# Patient Record
Sex: Male | Born: 1960 | Race: White | Hispanic: No | Marital: Single | Smoking: Current every day smoker
Health system: Southern US, Community
[De-identification: ages and names within clinical notes are randomized; demographics above are authoritative.]

## PROBLEM LIST (undated history)

## (undated) DIAGNOSIS — IMO0001 Reserved for inherently not codable concepts without codable children: Secondary | ICD-10-CM

## (undated) DIAGNOSIS — Z8781 Personal history of (healed) traumatic fracture: Secondary | ICD-10-CM

## (undated) DIAGNOSIS — E781 Pure hyperglyceridemia: Secondary | ICD-10-CM

## (undated) DIAGNOSIS — F411 Generalized anxiety disorder: Secondary | ICD-10-CM

## (undated) DIAGNOSIS — Z789 Other specified health status: Secondary | ICD-10-CM

## (undated) DIAGNOSIS — L739 Follicular disorder, unspecified: Secondary | ICD-10-CM

## (undated) DIAGNOSIS — M5430 Sciatica, unspecified side: Secondary | ICD-10-CM

## (undated) HISTORY — DX: Generalized anxiety disorder: F41.1

## (undated) HISTORY — DX: Other specified health status: Z78.9

## (undated) HISTORY — DX: Pure hyperglyceridemia: E78.1

## (undated) HISTORY — PX: BONY PELVIS SURGERY: SHX572

## (undated) HISTORY — DX: Reserved for inherently not codable concepts without codable children: IMO0001

## (undated) HISTORY — DX: Follicular disorder, unspecified: L73.9

## (undated) HISTORY — PX: KNEE SURGERY: SHX244

## (undated) HISTORY — DX: Personal history of (healed) traumatic fracture: Z87.81

## (undated) HISTORY — DX: Sciatica, unspecified side: M54.30

---

## 2016-07-29 DIAGNOSIS — Z720 Tobacco use: Secondary | ICD-10-CM | POA: Insufficient documentation

## 2016-07-29 DIAGNOSIS — Z87828 Personal history of other (healed) physical injury and trauma: Secondary | ICD-10-CM | POA: Insufficient documentation

## 2016-11-11 DIAGNOSIS — E781 Pure hyperglyceridemia: Secondary | ICD-10-CM | POA: Insufficient documentation

## 2017-07-27 ENCOUNTER — Ambulatory Visit
Payer: Medicare Other | Attending: Student in an Organized Health Care Education/Training Program | Admitting: Student in an Organized Health Care Education/Training Program

## 2017-07-27 ENCOUNTER — Encounter: Payer: Self-pay | Admitting: Student in an Organized Health Care Education/Training Program

## 2017-07-27 VITALS — BP 143/97 | HR 77 | Temp 98.1°F | Resp 18 | Ht 70.0 in | Wt 220.0 lb

## 2017-07-27 DIAGNOSIS — M79601 Pain in right arm: Secondary | ICD-10-CM | POA: Diagnosis not present

## 2017-07-27 DIAGNOSIS — G894 Chronic pain syndrome: Secondary | ICD-10-CM | POA: Diagnosis present

## 2017-07-27 DIAGNOSIS — M4854XA Collapsed vertebra, not elsewhere classified, thoracic region, initial encounter for fracture: Secondary | ICD-10-CM | POA: Insufficient documentation

## 2017-07-27 DIAGNOSIS — Z9889 Other specified postprocedural states: Secondary | ICD-10-CM | POA: Insufficient documentation

## 2017-07-27 DIAGNOSIS — Z79891 Long term (current) use of opiate analgesic: Secondary | ICD-10-CM | POA: Insufficient documentation

## 2017-07-27 DIAGNOSIS — M542 Cervicalgia: Secondary | ICD-10-CM | POA: Diagnosis not present

## 2017-07-27 DIAGNOSIS — M47816 Spondylosis without myelopathy or radiculopathy, lumbar region: Secondary | ICD-10-CM | POA: Insufficient documentation

## 2017-07-27 DIAGNOSIS — M25511 Pain in right shoulder: Secondary | ICD-10-CM | POA: Diagnosis not present

## 2017-07-27 DIAGNOSIS — F17210 Nicotine dependence, cigarettes, uncomplicated: Secondary | ICD-10-CM | POA: Insufficient documentation

## 2017-07-27 DIAGNOSIS — M25551 Pain in right hip: Secondary | ICD-10-CM | POA: Insufficient documentation

## 2017-07-27 DIAGNOSIS — M25552 Pain in left hip: Secondary | ICD-10-CM | POA: Diagnosis not present

## 2017-07-27 DIAGNOSIS — S22000A Wedge compression fracture of unspecified thoracic vertebra, initial encounter for closed fracture: Secondary | ICD-10-CM

## 2017-07-27 DIAGNOSIS — Z8781 Personal history of (healed) traumatic fracture: Secondary | ICD-10-CM | POA: Diagnosis not present

## 2017-07-27 MED ORDER — MELOXICAM 15 MG PO TABS: 7.5000 mg | ORAL_TABLET | Freq: Two times a day (BID) | ORAL | 0 refills | Status: AC

## 2017-07-27 MED ORDER — TIZANIDINE HCL 4 MG PO CAPS: 4.0000 mg | ORAL_CAPSULE | Freq: Three times a day (TID) | ORAL | 0 refills | Status: DC | PRN

## 2017-07-27 NOTE — Progress Notes (Signed)
Safety precautions to be maintained throughout the outpatient stay will include: orient to surroundings, keep bed in low position, maintain call bell within reach at all times, provide assistance with transfer out of bed and ambulation.  

## 2017-07-27 NOTE — Progress Notes (Signed)
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 Thank you

## 2017-07-27 NOTE — Patient Instructions (Signed)
#  1. Please sign if patient release of records so we can get all of your imaging studies from prior hospitals. I'm specifically interested in x-rays, CT scans and MRIs. Please send them to our clinic so that we can fax them into our medical record system.  #2 referral to pain psychology   #3. Tizanidine 4 mg 3 times a day when necessary muscle spasms, Mobic 7.5 mg twice a day for 14 days. Patient instructed to discontinue ibuprofen while he is taking meloxicam. He is instructed to take this after meals.  #4. Urine drug sign today.  #5. Please follow up with me only after you have done your pain psychology evaluation and after all of your records have been sent to our clinic. I will not be able to see you and provide a management plan until we have all of your imaging studies and the pain psychology evaluation. Thank you

## 2017-08-01 ENCOUNTER — Other Ambulatory Visit: Payer: Self-pay

## 2017-08-02 LAB — COMPLIANCE DRUG ANALYSIS, UR

## 2017-09-25 ENCOUNTER — Encounter: Payer: Self-pay | Admitting: Urology

## 2017-09-25 ENCOUNTER — Ambulatory Visit (INDEPENDENT_AMBULATORY_CARE_PROVIDER_SITE_OTHER): Payer: Medicare Other | Admitting: Urology

## 2017-09-25 VITALS — BP 152/95 | HR 92 | Ht 70.0 in | Wt 225.1 lb

## 2017-09-25 DIAGNOSIS — Z125 Encounter for screening for malignant neoplasm of prostate: Secondary | ICD-10-CM

## 2017-09-25 DIAGNOSIS — N5089 Other specified disorders of the male genital organs: Secondary | ICD-10-CM

## 2017-09-25 LAB — URINALYSIS, COMPLETE
BILIRUBIN UA: NEGATIVE
GLUCOSE, UA: NEGATIVE
KETONES UA: NEGATIVE
Leukocytes, UA: NEGATIVE
NITRITE UA: NEGATIVE
Protein, UA: NEGATIVE
RBC, UA: NEGATIVE
Specific Gravity, UA: 1.025 (ref 1.005–1.030)
Urobilinogen, Ur: 0.2 mg/dL (ref 0.2–1.0)
pH, UA: 6 (ref 5.0–7.5)

## 2017-09-25 NOTE — Progress Notes (Signed)
09/25/2017 9:10 AM   Burr Medico [birth year] [id number]  Referring provider: Barbette Reichmann, MD [address] [facility] [place], [place] [address]  Chief Complaint  Patient presents with  . New Patient (Initial Visit)    testicle lump    HPI: Pt c/o "lump" in the cords for 18 - 24 months. He notices they get bigger and smaller, left > right. He's not been treated for it. No pain. He has a lot of back and abd pain from prior back and pelvic fractures and does PT. He has two kids. He has a good libido ("great"). He has adequate erections. No weak stream, frequency or urgency. He c/o decreased ejaculate volume.   Modifying factors: There are no other modifying factors  Associated signs and symptoms: There are no other associated signs and symptoms Aggravating and relieving factors: There are no other aggravating or relieving factors Severity: Moderate Duration: Persistent   Many other complaints - diarrhea, skin rash, sinuses.   PMH: Past Medical History:  Diagnosis Date  . Medical history reviewed with no changes     Surgical History: Past Surgical History:  Procedure Laterality Date  . BONY PELVIS SURGERY Left   . KNEE SURGERY Bilateral     Home Medications:  Allergies as of 09/25/2017   No Known Allergies     Medication List       Accurate as of 09/25/17  9:10 AM. Always use your most recent med list.          CALCIUM 600 600 MG Tabs tablet Generic drug:  calcium carbonate Take 600 mg by mouth daily.   gabapentin 800 MG tablet Commonly known as:  NEURONTIN Take 800 mg by mouth 4 (four) times daily.   Glucosamine 500 MG Caps Take by mouth 3 (three) times daily.   glucosamine-chondroitin 500-400 MG tablet Take 1 tablet by mouth 3 (three) times daily.   ibuprofen 800 MG tablet Commonly known as:  ADVIL,MOTRIN Take 800 mg by mouth 2 (two) times daily.   Magnesium 500 MG Caps Take by mouth 3 (three) times daily.     multivitamin capsule Take 1 capsule by mouth daily.   oxyCODONE-acetaminophen 5-325 MG tablet Commonly known as:  PERCOCET/ROXICET Take by mouth every 8 (eight) hours as needed for severe pain.   tiZANidine 4 MG capsule Commonly known as:  ZANAFLEX Take 1 capsule (4 mg total) by mouth 3 (three) times daily as needed for muscle spasms.       Allergies: No Known Allergies  Family History: Family History  Problem Relation Age of Onset  . Prostate cancer Neg Hx   . Bladder Cancer Neg Hx   . Kidney cancer Neg Hx     Social History:  reports that he has been smoking.  He has been smoking about 2.00 packs per day. He has quit using smokeless tobacco. He reports that he drinks alcohol. He reports that he does not use drugs.  ROS: UROLOGY Frequent Urination?: No Hard to postpone urination?: No Burning/pain with urination?: No Get up at night to urinate?: No Leakage of urine?: Yes Urine stream starts and stops?: No Trouble starting stream?: No Do you have to strain to urinate?: No Blood in urine?: No Urinary tract infection?: No Sexually transmitted disease?: No Injury to kidneys or bladder?: No Painful intercourse?: No Weak stream?: No Erection problems?: No Penile pain?: No  Gastrointestinal Nausea?: No Vomiting?: No Indigestion/heartburn?: Yes Diarrhea?: Yes Constipation?: No  Constitutional Fever: No Night sweats?: No  Weight loss?: No Fatigue?: No  Skin Skin rash/lesions?: Yes Itching?: Yes  Eyes Blurred vision?: Yes Double vision?: No  Ears/Nose/Throat Sore throat?: Yes Sinus problems?: Yes  Hematologic/Lymphatic Swollen glands?: No Easy bruising?: No  Cardiovascular Leg swelling?: No Chest pain?: No  Respiratory Cough?: Yes Shortness of breath?: No  Endocrine Excessive thirst?: No  Musculoskeletal Back pain?: Yes Joint pain?: Yes  Neurological Headaches?: No Dizziness?: No  Psychologic Depression?: No Anxiety?:  No  Physical Exam: BP (!) 152/95 (BP Location: Right Arm, Patient Position: Sitting, Cuff Size: Normal)   Pulse 92   Ht 5\' 10"  (1.778 m)   Wt 102.1 kg (225 lb 1.6 oz)   BMI 32.30 kg/m   Constitutional:  Alert and oriented, No acute distress. HEENT: [place] AT, moist mucus membranes.  Trachea midline, no masses. Cardiovascular: No clubbing, cyanosis, or edema. Respiratory: Normal respiratory effort, no increased work of breathing. GI: Abdomen is soft, nontender, nondistended, no abdominal masses, no inguinal hernia GU: No CVA tenderness.  Penis - circumcised, no lesion or mass, sightly buried  Scrotum - testicles, epididymides, cords palpably normal  **He was examined standing and supine** DRE - prostate 20 grams, normal  Skin: No rashes, bruises or suspicious lesions. Lymph: No cervical or inguinal adenopathy. Neurologic: Grossly intact, no focal deficits, moving all 4 extremities. Psychiatric: Normal mood and affect.  Laboratory Data: No results found for: WBC, HGB, HCT, MCV, PLT  No results found for: CREATININE  No results found for: PSA1  No results found for: TESTOSTERONE  No results found for: HGBA1C  Urinalysis No results found for: SPECGRAV, PHUR, COLORU, APPEARANCEUR, LEUKOCYTESUR, PROTEINUR, GLUCOSEU, KETONESU, RBCU, BILIRUBINUR, UUROB, NITRITE  No results found for: LABMICR, WBCUA, RBCUA, LABEPIT, MUCUS, BACTERIA   No results found for this or any previous visit. No results found for this or any previous visit. No results found for this or any previous visit. No results found for this or any previous visit. No results found for this or any previous visit. No results found for this or any previous visit. No results found for this or any previous visit. No results found for this or any previous visit.  Assessment & Plan:    1. 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 Pt reassured. We went over testicle and cord anatomy and discussed if he  notes any changes to f/u for another exam. His testicles are on the smaller end, so we discussed checking a T level. Also, discussed PSA screening. Discussed scrotal US but pt declined. He did have mri in 2017 and said his testicles may have been seen - I reviewed the reports and no comment on testicles. 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 He asked if I could check for toxins and we discussed a T level is a check of his testicle function but not specific for toxins and he should talk to Dr. Marcello Fennel. He said this is the first time in months he's even had trouble finding the knots in the testicles. He's perplexed why his exam is so normal - even when he does a self exam.    I did call Dr. Marcello Fennel to discuss patients psychiatric history. Nurse noted patient was talking to himself in the room and walking down the hall. Dr. Marcello Fennel made a note and will evaluate patient. Pt did not seem violent or threatening.  - Urinalysis, Complete   No Follow-up on file.  Jerilee FieldESKRIDGE, Lazlo Tunney, MD  [facility] [address], [address] [place], [place] [address] [phone]

## 2017-09-26 LAB — PSA: PROSTATE SPECIFIC AG, SERUM: 0.6 ng/mL (ref 0.0–4.0)

## 2017-09-26 LAB — TESTOSTERONE: TESTOSTERONE: 255 ng/dL — AB (ref 264–916)

## 2017-09-29 ENCOUNTER — Telehealth: Payer: Self-pay | Admitting: *Deleted

## 2017-09-29 NOTE — Telephone Encounter (Signed)
Patient had called earlier wanting lab results and they had not been reviewed by the provider yet. Michelle bell printed the lab and tried to get another MD in the clinic to address it and the answer was Dr. Mena GoesEskridge would be in next week. Patient states he was told on the day of blood work it would not take but 48 hours and he was worried something was wrong. I let him know that's about the time they came back from being ran not when the MD has had time to review and send nurse a message to call. I let him know that I would send you a message to please look at his labs. Patient ok with plan.

## 2017-10-03 ENCOUNTER — Telehealth: Payer: Self-pay

## 2017-10-03 NOTE — Telephone Encounter (Signed)
Christopher Mcneil, Matthew, MD  [name], [name], CMA        Notify patient his PSA was normal. His testosterone was a little low at 255. Give results.    Spoke to pt. Gave results. Pt verbalized understanding.

## 2017-10-18 ENCOUNTER — Telehealth: Payer: Self-pay | Admitting: Urology

## 2017-10-18 NOTE — Telephone Encounter (Signed)
Patient came into the office today and was upset about the after visit summary that you stated for him to return if his symptoms worsened or failed to improve. He stated that you told him that his condition would never improve. He would like for you to listen to the voicemail that he left and either call him to discuss or I did offer him an appointment.  Please advise  Marcelino DusterMichelle

## 2017-11-17 DIAGNOSIS — L739 Follicular disorder, unspecified: Secondary | ICD-10-CM | POA: Insufficient documentation

## 2018-03-20 DIAGNOSIS — D72829 Elevated white blood cell count, unspecified: Secondary | ICD-10-CM | POA: Insufficient documentation

## 2019-07-18 DIAGNOSIS — M545 Low back pain, unspecified: Secondary | ICD-10-CM | POA: Insufficient documentation

## 2019-07-18 DIAGNOSIS — F411 Generalized anxiety disorder: Secondary | ICD-10-CM | POA: Insufficient documentation

## 2019-07-18 DIAGNOSIS — G8929 Other chronic pain: Secondary | ICD-10-CM | POA: Insufficient documentation

## 2019-10-02 ENCOUNTER — Other Ambulatory Visit: Payer: Self-pay

## 2019-10-02 ENCOUNTER — Encounter: Payer: Self-pay | Admitting: Urology

## 2019-10-02 ENCOUNTER — Ambulatory Visit (INDEPENDENT_AMBULATORY_CARE_PROVIDER_SITE_OTHER): Payer: Medicare HMO | Admitting: Urology

## 2019-10-02 VITALS — BP 145/87 | HR 88 | Ht 70.0 in | Wt 226.0 lb

## 2019-10-02 DIAGNOSIS — E291 Testicular hypofunction: Secondary | ICD-10-CM

## 2019-10-02 DIAGNOSIS — N5319 Other ejaculatory dysfunction: Secondary | ICD-10-CM

## 2019-10-02 NOTE — Progress Notes (Signed)
10/02/2019 10:42 AM   [name] 1961/09/15 [id number]  Referring provider: Tracie Harrier, MD [address] [facility] [place],  [place] [address]  Chief Complaint  Patient presents with  . Erectile Dysfunction    HPI: Christopher Mcneil is a 58 year old male seen in consultation at the request of Dr. Ginette Pitman for evaluation of erectile dysfunction.  This is a new problem.  He was seen in 2018 by Dr. Junious Silk for evaluation masses of the spermatic cord.  His primary complaint is no sensation of ejaculation which has been present for approximately 6 months.  Unable to identify any precipitating, aggravating or alleviating factors and states the problem is always present.  He does have a normal volume of semen but complains of decreased sensation.  He is not sexually active but states the reason is because of this problem.  He feels his erections are adequate.  He was seen by Dr. Junious Silk October 2018 for spermatic cord "masses".  His examination was normal at that time and he was unable to find the abnormalities for Dr. Junious Silk.  He complains today that he still has these masses and also wanted to have them reassessed.  He denies scrotal pain.  He does note some tiredness and fatigue.  He did have a testosterone level checked in 2018 which was low at 255 ng/dL.  He relates good libido.  SHIM was 19/25 indicating mild ED.  He denies bothersome lower urinary tract symptoms.  Denies dysuria or gross hematuria.  He is on gabapentin for chronic low back pain.   PMH: Past Medical History:  Diagnosis Date  . Folliculitis   . GAD (generalized anxiety disorder)   . History of fracture of pelvis   . Hypertriglyceridemia   . Medical history reviewed with no changes   . Sciatic nerve pain     Surgical History: Past Surgical History:  Procedure Laterality Date  . BONY PELVIS SURGERY Left   . KNEE SURGERY Bilateral     Home Medications:  Allergies as of 10/02/2019    No Known Allergies     Medication List       Accurate as of October 02, 2019 10:42 AM. If you have any questions, ask your nurse or doctor.        Calcium 600 600 MG Tabs tablet Generic drug: calcium carbonate Take 600 mg by mouth daily.   DULoxetine 30 MG capsule Commonly known as: CYMBALTA Take by mouth.   gabapentin 300 MG capsule Commonly known as: NEURONTIN Take 600 mg by mouth 3 (three) times daily. What changed: Another medication with the same name was removed. Continue taking this medication, and follow the directions you see here. Changed by: Abbie Sons, MD   Glucosamine 500 MG Caps Take by mouth 3 (three) times daily.   glucosamine-chondroitin 500-400 MG tablet Take 1 tablet by mouth 3 (three) times daily.   hydrocortisone 2.5 % cream APPLY TO AFFECTED AREA TWICE A DAY   ibuprofen 800 MG tablet Commonly known as: ADVIL Take by mouth. What changed: Another medication with the same name was removed. Continue taking this medication, and follow the directions you see here. Changed by: Abbie Sons, MD   linezolid 600 MG tablet Commonly known as: ZYVOX Take 600 mg by mouth 2 (two) times daily.   Magnesium 200 MG Tabs Take by mouth.   Magnesium 500 MG Caps Take by mouth 3 (three) times daily.   multivitamin capsule Take 1 capsule by mouth daily.  nystatin cream Commonly known as: MYCOSTATIN APPLY TO AFFECTED AREA TWICE A DAY   oxyCODONE-acetaminophen 5-325 MG tablet Commonly known as: PERCOCET/ROXICET Take by mouth. What changed: Another medication with the same name was removed. Continue taking this medication, and follow the directions you see here. Changed by: Riki AltesScott C , MD   tiZANidine 4 MG tablet Commonly known as: ZANAFLEX Take 4 mg by mouth 3 (three) times daily. What changed: Another medication with the same name was removed. Continue taking this medication, and follow the directions you see here. Changed by: Riki AltesScott C  , MD   triamcinolone 0.1 % paste Commonly known as: KENALOG Apply topically.       Allergies: No Known Allergies  Family History: Family History  Problem Relation Age of Onset  . Prostate cancer Neg Hx   . Bladder Cancer Neg Hx   . Kidney cancer Neg Hx     Social History:  reports that he has been smoking. He has been smoking about 2.00 packs per day. He has quit using smokeless tobacco. He reports current alcohol use. He reports that he does not use drugs.  ROS: UROLOGY Frequent Urination?: No Hard to postpone urination?: No Burning/pain with urination?: No Get up at night to urinate?: No Leakage of urine?: No Urine stream starts and stops?: No Trouble starting stream?: No Do you have to strain to urinate?: No Blood in urine?: No Urinary tract infection?: No Sexually transmitted disease?: No Injury to kidneys or bladder?: No Painful intercourse?: No Weak stream?: No Erection problems?: No Penile pain?: No  Gastrointestinal Nausea?: No Vomiting?: No Indigestion/heartburn?: Yes Diarrhea?: Yes Constipation?: No  Constitutional Fever: No Night sweats?: No Weight loss?: No Fatigue?: Yes  Skin Skin rash/lesions?: Yes Itching?: Yes  Eyes Blurred vision?: Yes Double vision?: No  Ears/Nose/Throat Sore throat?: No Sinus problems?: Yes  Hematologic/Lymphatic Swollen glands?: No Easy bruising?: No  Cardiovascular Leg swelling?: No Chest pain?: No  Respiratory Cough?: Yes Shortness of breath?: Yes  Endocrine Excessive thirst?: No  Musculoskeletal Back pain?: Yes Joint pain?: Yes  Neurological Headaches?: No Dizziness?: No  Psychologic Depression?: No Anxiety?: No  Physical Exam: BP (!) 145/87 (BP Location: Left Arm, Patient Position: Sitting, Cuff Size: Normal)   Pulse 88   Ht 5\' 10"  (1.778 m)   Wt 226 lb (102.5 kg)   BMI 32.43 kg/m   Constitutional:  Alert and oriented, No acute distress. HEENT: [place] AT, moist mucus  membranes.  Trachea midline, no masses. Cardiovascular: No clubbing, cyanosis, or edema. Respiratory: Normal respiratory effort, no increased work of breathing. GI: Abdomen is soft, nontender, nondistended, no abdominal masses GU: Phallus is normal without lesions.  Testes descended bilaterally without masses or tenderness.  Spermatic cord/epididymis palpably normal bilaterally without masses.  He was again today unable to locate these masses that he refers to. Lymph: No cervical or inguinal lymphadenopathy. Skin: No rashes, bruises or suspicious lesions. Neurologic: Grossly intact, no focal deficits, moving all 4 extremities. Psychiatric: Normal mood and affect.   Assessment & Plan:    - Ejaculatory disorder We discussed that ejaculatory problems are difficult to treat.  He did have a low testosterone level in 2018 which could potentially be contributing to his problem.  Testosterone level was repeated today along with a LH and prolactin.  He will be notified with results.  Potentially a brief trial of TRT or Clomid to increase his testosterone level could be done to see if this improves his problem.  He will be notified with  his lab results and further recommendations.   Riki Altes, MD  [facility] [address], [address] [place], [place] [address] [phone]

## 2019-10-03 ENCOUNTER — Ambulatory Visit: Payer: Medicare Other | Admitting: Urology

## 2019-10-03 LAB — TESTOSTERONE: Testosterone: 304 ng/dL (ref 264–916)

## 2019-10-03 LAB — PROLACTIN: Prolactin: 20.1 ng/mL — ABNORMAL HIGH (ref 4.0–15.2)

## 2019-10-03 LAB — LUTEINIZING HORMONE: LH: 5.8 m[IU]/mL (ref 1.7–8.6)

## 2019-10-06 ENCOUNTER — Encounter: Payer: Self-pay | Admitting: Urology

## 2019-10-07 ENCOUNTER — Telehealth: Payer: Self-pay | Admitting: Urology

## 2019-10-07 ENCOUNTER — Other Ambulatory Visit: Payer: Self-pay | Admitting: Urology

## 2019-10-07 DIAGNOSIS — E221 Hyperprolactinemia: Secondary | ICD-10-CM

## 2019-10-07 DIAGNOSIS — N5089 Other specified disorders of the male genital organs: Secondary | ICD-10-CM

## 2019-10-07 NOTE — Telephone Encounter (Signed)
Pt would like a call back to discuss lab results. He also states that he has knots on both testicles and just finished a course of ABX, he asked if an [place] could be scheduled?

## 2019-10-08 NOTE — Telephone Encounter (Signed)
Order was entered.  Will call with results 

## 2019-10-08 NOTE — Telephone Encounter (Signed)
Called pt no answer. Unable to leave message as no DPR is on file. Advised pt to call back.

## 2019-10-08 NOTE — Telephone Encounter (Signed)
Called pt informed him of the need for referral to Endocrinology for abnormal labs. Pt gave verbal understanding. Pt states that he discussed a possible U/S with provider at last appt, he states that the knots are back and would like to proceed with U/S. Please advise.

## 2019-10-10 ENCOUNTER — Other Ambulatory Visit: Payer: Self-pay | Admitting: Urology

## 2019-10-10 DIAGNOSIS — N5089 Other specified disorders of the male genital organs: Secondary | ICD-10-CM

## 2019-10-10 NOTE — Telephone Encounter (Signed)
Called pt informed him of the need to get u/s will call with results

## 2019-10-16 ENCOUNTER — Ambulatory Visit
Admission: RE | Admit: 2019-10-16 | Discharge: 2019-10-16 | Disposition: A | Discharge status: Home / Self Care | Payer: Medicare HMO | Source: Ambulatory Visit | Attending: Urology | Admitting: Urology

## 2019-10-16 ENCOUNTER — Ambulatory Visit: Payer: Medicare Other

## 2019-10-16 ENCOUNTER — Other Ambulatory Visit: Payer: Self-pay

## 2019-10-16 DIAGNOSIS — N5089 Other specified disorders of the male genital organs: Secondary | ICD-10-CM | POA: Insufficient documentation

## 2019-10-21 ENCOUNTER — Telehealth: Payer: Self-pay

## 2019-10-21 NOTE — Telephone Encounter (Signed)
-----   Message from Abbie Sons, MD sent at 10/20/2019  3:28 PM EST ----- No significant abnormalities identified on scrotal ultrasound

## 2019-10-21 NOTE — Telephone Encounter (Signed)
Called pt, no answer. Unable to leave detailed message as no DPR is signed. Advised pt to call back. 1st attempt

## 2019-10-22 NOTE — Telephone Encounter (Signed)
Called pt no answer. LM for pt to call back. 2nd attempt.  

## 2019-10-24 NOTE — Telephone Encounter (Signed)
Called pt no answer. 3rd attempt. Letter mailed.  

## 2019-10-25 NOTE — Progress Notes (Signed)
[facility]  [phone] [phone]  ID: Christopher Mcneil Medico OB: February 04, 1961  MR#: [id number]  AOZ#:[id number]  Patient Care Team: Barbette Reichmann, MD as PCP - General (Internal Medicine)  CHIEF COMPLAINT: Leukocytosis.  INTERVAL HISTORY: Patient is a 58 year old male with multiple medical problems who was referred for persistently elevated white blood cell count.  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  He also complains of erectile dysfunction and has recently been evaluated by urology.  He has no neurologic complaints.  He denies any fevers.  He has a good appetite and denies weight loss.  He denies any pain.  He has no chest pain, shortness of breath, cough, or hemoptysis.  He denies any nausea, vomiting, constipation, or diarrhea.  He has no urinary complaints.  Patient otherwise feels well and offers no further specific complaints today.  REVIEW OF SYSTEMS:   Review of Systems  Constitutional: Negative.  Negative for fever, malaise/fatigue and weight loss.  Respiratory: Negative.  Negative for cough and shortness of breath.   Cardiovascular: Negative.  Negative for chest pain and leg swelling.  Gastrointestinal: Negative.  Negative for abdominal pain.  Genitourinary: Negative.  Negative for dysuria and urgency.  Musculoskeletal: Negative.  Negative for back pain.  Skin: Negative.  Negative for rash.  Neurological: Negative.  Negative for dizziness, focal weakness, weakness and headaches.  Psychiatric/Behavioral: Negative.  The patient is not nervous/anxious.     As per HPI. Otherwise, a complete review of systems is negative.  PAST MEDICAL HISTORY: Past Medical History:  Diagnosis Date   Folliculitis    GAD (generalized anxiety disorder)    History of fracture of pelvis    Hypertriglyceridemia    Medical history reviewed with no changes     Sciatic nerve pain     PAST SURGICAL HISTORY: Past Surgical History:  Procedure Laterality Date   BONY PELVIS SURGERY Left    KNEE SURGERY Bilateral     FAMILY HISTORY: Family History  Problem Relation Age of Onset   Prostate cancer Neg Hx    Bladder Cancer Neg Hx    Kidney cancer Neg Hx     ADVANCED DIRECTIVES (Y/N):  N  HEALTH MAINTENANCE: Social History   Tobacco Use   Smoking status: Current Every Day Smoker    Packs/day: 2.00   Smokeless tobacco: Current User  Substance Use Topics   Alcohol use: Not on file    Comment: occasionally   Drug use: No     Colonoscopy:  PAP:  Bone density:  Lipid panel:  No Known Allergies  Current Outpatient Medications  Medication Sig Dispense Refill   calcium carbonate (CALCIUM 600) 600 MG TABS tablet Take 600 mg by mouth daily.     gabapentin (NEURONTIN) 300 MG capsule Take 600 mg by mouth 3 (three) times daily.     glucosamine-chondroitin 500-400 MG tablet Take 1 tablet by mouth 3 (three) times daily.     HYDROcodone-acetaminophen (NORCO/VICODIN) 5-325 MG tablet Take by mouth.     hydrocortisone 2.5 % cream APPLY TO AFFECTED AREA TWICE A DAY     ibuprofen (ADVIL) 800 MG tablet Take by mouth.     Magnesium 500 MG CAPS Take by mouth 3 (three) times daily.     Multiple Vitamin (MULTIVITAMIN) capsule Take 1 capsule by mouth daily.     nystatin cream (MYCOSTATIN) APPLY TO AFFECTED AREA TWICE A DAY     tiZANidine (  ZANAFLEX) 4 MG tablet Take 4 mg by mouth 3 (three) times daily.     triamcinolone (KENALOG) 0.1 % paste Apply topically.     DULoxetine (CYMBALTA) 30 MG capsule Take by mouth.     Glucosamine 500 MG CAPS Take by mouth 3 (three) times daily.     linezolid (ZYVOX) 600 MG tablet Take 600 mg by mouth 2 (two) times daily.     Magnesium 200 MG TABS Take by mouth.     No current facility-administered medications for this visit.     OBJECTIVE: Vitals:   10/28/19 1505  BP: 126/90  Pulse: 76    Temp: (!) 96.2 F (35.7 C)  SpO2: 98%     Body mass index is 32.49 kg/m.    ECOG FS:1 - Symptomatic but completely ambulatory  General: Well-developed, well-nourished, no acute distress. Eyes: Pink conjunctiva, anicteric sclera. HEENT: Normocephalic, moist mucous membranes, clear oropharnyx. Lungs: Clear to auscultation bilaterally. Heart: Regular rate and rhythm. No rubs, murmurs, or gallops. Abdomen: Soft, nontender, nondistended. No organomegaly noted, normoactive bowel sounds. Musculoskeletal: No edema, cyanosis, or clubbing. Neuro: Alert, answering all questions appropriately. Cranial nerves grossly intact. Skin: No rashes or petechiae noted. Psych: Normal affect. Lymphatics: Palpable lymph nodes present in the right axilla.   LAB RESULTS:  No results found for: NA, K, CL, CO2, GLUCOSE, BUN, CREATININE, CALCIUM, PROT, ALBUMIN, AST, ALT, ALKPHOS, BILITOT, GFRNONAA, GFRAA  Lab Results  Component Value Date   WBC 14.1 (H) 10/28/2019   NEUTROABS 7.4 10/28/2019   HGB 15.2 10/28/2019   HCT 46.0 10/28/2019   MCV 96.4 10/28/2019   PLT 226 10/28/2019     STUDIES: US Scrotum W/doppler  Result Date: 10/16/2019 CLINICAL DATA:  Spermatic cord mass EXAM: SCROTAL ULTRASOUND DOPPLER ULTRASOUND OF THE TESTICLES TECHNIQUE: Complete ultrasound examination of the testicles, epididymis, and other scrotal structures was performed. Color and spectral Doppler ultrasound were also utilized to evaluate blood flow to the testicles. COMPARISON:  None. FINDINGS: Right testicle Measurements: 3.5 by 1.7 by 2.4 cm. No mass or microlithiasis visualized. Left testicle Measurements: 3.4 by 1.7 by 2.3 cm. No mass or microlithiasis visualized. Right epididymis:  0.4 cm epididymal cyst versus spermatocele. Left epididymis:  Normal in size and appearance. Hydrocele:  Very small bilateral hydroceles. Varicocele: Small right varicocele with venous structures along the area of palpable concern measuring up to 0.4  cm. Pulsed Doppler interrogation of both testes demonstrates normal low resistance arterial and venous waveforms bilaterally. IMPRESSION: 1. The area palpable concern appears to correspond to a small right varicocele. Venous element measures up to 0.4 cm in diameter. 2. Very small bilateral hydroceles. 3. 0.4 cm right epididymal cyst versus spermatocele. 4. No significant testicular abnormality is observed. Electronically Signed   By: Van Clines M.D.   On: 10/16/2019 17:09    ASSESSMENT: Leukocytosis  PLAN:    1. Leukocytosis: Patient noted to have a persistently elevated white blood cell count at 14.1 today.  Although initially thought to be reactive, his neutrophil count is within normal limits.  Have ordered flow cytometry and BCR-ABL mutation for further evaluation.  Patient will have a video assisted telemedicine visit in approximately 3 weeks to discuss the results of any further diagnostic testing necessary. 2.  Right axillary infection: Unclear if patient may benefit from an I&D.  Continue follow-up and treatment per primary care. 3.  Erectile dysfunction/varicocele/hydrocele: Continue follow-up and work-up per urology.  I spent a total of 45 minutes face-to-face with the patient of which  greater than 50% of the visit was spent in counseling and coordination of care as detailed above.   Patient expressed understanding and was in agreement with this plan. He also understands that He can call clinic at any time with any questions, concerns, or complaints.    Jeralyn Ruthsimothy J Ade Stmarie, MD   10/29/2019 6:34 AM

## 2019-10-28 ENCOUNTER — Inpatient Hospital Stay: Payer: Medicare HMO

## 2019-10-28 ENCOUNTER — Encounter: Payer: Self-pay | Admitting: Oncology

## 2019-10-28 ENCOUNTER — Other Ambulatory Visit: Payer: Self-pay

## 2019-10-28 ENCOUNTER — Inpatient Hospital Stay: Payer: Medicare HMO | Attending: Oncology | Admitting: Oncology

## 2019-10-28 VITALS — BP 126/90 | HR 76 | Temp 96.2°F | Wt 226.4 lb

## 2019-10-28 DIAGNOSIS — L02412 Cutaneous abscess of left axilla: Secondary | ICD-10-CM

## 2019-10-28 DIAGNOSIS — Z791 Long term (current) use of non-steroidal anti-inflammatories (NSAID): Secondary | ICD-10-CM | POA: Insufficient documentation

## 2019-10-28 DIAGNOSIS — N528 Other male erectile dysfunction: Secondary | ICD-10-CM | POA: Diagnosis not present

## 2019-10-28 DIAGNOSIS — E781 Pure hyperglyceridemia: Secondary | ICD-10-CM | POA: Insufficient documentation

## 2019-10-28 DIAGNOSIS — F17210 Nicotine dependence, cigarettes, uncomplicated: Secondary | ICD-10-CM | POA: Insufficient documentation

## 2019-10-28 DIAGNOSIS — N433 Hydrocele, unspecified: Secondary | ICD-10-CM | POA: Insufficient documentation

## 2019-10-28 DIAGNOSIS — I861 Scrotal varices: Secondary | ICD-10-CM | POA: Insufficient documentation

## 2019-10-28 DIAGNOSIS — L02411 Cutaneous abscess of right axilla: Secondary | ICD-10-CM | POA: Insufficient documentation

## 2019-10-28 DIAGNOSIS — Z79899 Other long term (current) drug therapy: Secondary | ICD-10-CM | POA: Insufficient documentation

## 2019-10-28 DIAGNOSIS — D72829 Elevated white blood cell count, unspecified: Secondary | ICD-10-CM

## 2019-10-28 LAB — CBC WITH DIFFERENTIAL/PLATELET
Abs Immature Granulocytes: 0.05 10*3/uL (ref 0.00–0.07)
Basophils Absolute: 0.2 10*3/uL — ABNORMAL HIGH (ref 0.0–0.1)
Basophils Relative: 2 %
Eosinophils Absolute: 0.9 10*3/uL — ABNORMAL HIGH (ref 0.0–0.5)
Eosinophils Relative: 6 %
HCT: 46 % (ref 39.0–52.0)
Hemoglobin: 15.2 g/dL (ref 13.0–17.0)
Immature Granulocytes: 0 %
Lymphocytes Relative: 31 %
Lymphs Abs: 4.4 10*3/uL — ABNORMAL HIGH (ref 0.7–4.0)
MCH: 31.9 pg (ref 26.0–34.0)
MCHC: 33 g/dL (ref 30.0–36.0)
MCV: 96.4 fL (ref 80.0–100.0)
Monocytes Absolute: 1.2 10*3/uL — ABNORMAL HIGH (ref 0.1–1.0)
Monocytes Relative: 8 %
Neutro Abs: 7.4 10*3/uL (ref 1.7–7.7)
Neutrophils Relative %: 53 %
Platelets: 226 10*3/uL (ref 150–400)
RBC: 4.77 MIL/uL (ref 4.22–5.81)
RDW: 13 % (ref 11.5–15.5)
WBC: 14.1 10*3/uL — ABNORMAL HIGH (ref 4.0–10.5)
nRBC: 0 % (ref 0.0–0.2)

## 2019-10-28 LAB — LACTATE DEHYDROGENASE: LDH: 125 U/L (ref 98–192)

## 2019-10-28 NOTE — Progress Notes (Signed)
Patient here today for elevated WBC and lumps under his right axilla area.

## 2019-11-01 LAB — COMP PANEL: LEUKEMIA/LYMPHOMA

## 2019-11-08 LAB — BCR-ABL1, CML/ALL, PCR, QUANT: Interpretation (BCRAL):: NEGATIVE

## 2019-11-21 NOTE — Progress Notes (Signed)
[facility]  [phone] [phone]  ID: Christopher Mcneil OB: [birth year]  MR#: [id number]  EZM#:[id number]  Patient Care Team: Tracie Harrier, MD as PCP - General (Internal Medicine)  I connected with Christopher Mcneil on 11/26/19 at  1:30 PM EST by telephone visit and verified that I am speaking with the correct person using two identifiers.   I discussed the limitations, risks, security and privacy concerns of performing an evaluation and management service by telemedicine and the availability of in-person appointments. I also discussed with the patient that there may be a patient responsible charge related to this service. The patient expressed understanding and agreed to proceed.   Other persons participating in the visit and their role in the encounter: Patient, MD.  Patient's location: Home. Provider's location: Clinic.  CHIEF COMPLAINT: Leukocytosis.  INTERVAL HISTORY: Patient agreed to further evaluation and discussion of his laboratory results via telephone.  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  He continues to have problems with erectile dysfunction as well. He has no neurologic complaints.  He denies any fevers.  He has a good appetite and denies weight loss.  He denies any pain.  He has no chest pain, shortness of breath, cough, or hemoptysis.  He denies any nausea, vomiting, constipation, or diarrhea.  He has no urinary complaints.  Patient offers no further specific complaints today.  REVIEW OF SYSTEMS:   Review of Systems  Constitutional: Negative.  Negative for fever, malaise/fatigue and weight loss.  Respiratory: Negative.  Negative for cough and shortness of breath.   Cardiovascular: Negative.  Negative for chest pain and leg swelling.  Gastrointestinal: Negative.  Negative for abdominal pain.  Genitourinary: Negative.  Negative for dysuria and urgency.  Musculoskeletal: Negative.   Negative for back pain.  Skin: Negative.  Negative for rash.  Neurological: Negative.  Negative for dizziness, focal weakness, weakness and headaches.  Psychiatric/Behavioral: Negative.  The patient is not nervous/anxious.     As per HPI. Otherwise, a complete review of systems is negative.  PAST MEDICAL HISTORY: Past Medical History:  Diagnosis Date  . Folliculitis   . GAD (generalized anxiety disorder)   . History of fracture of pelvis   . Hypertriglyceridemia   . Medical history reviewed with no changes   . Sciatic nerve pain     PAST SURGICAL HISTORY: Past Surgical History:  Procedure Laterality Date  . BONY PELVIS SURGERY Left   . KNEE SURGERY Bilateral     FAMILY HISTORY: Family History  Problem Relation Age of Onset  . Prostate cancer Neg Hx   . Bladder Cancer Neg Hx   . Kidney cancer Neg Hx     ADVANCED DIRECTIVES (Y/N):  N  HEALTH MAINTENANCE: Social History   Tobacco Use  . Smoking status: Current Every Day Smoker    Packs/day: 2.00  . Smokeless tobacco: Current User  Substance Use Topics  . Alcohol use: Not on file    Comment: occasionally  . Drug use: No     Colonoscopy:  PAP:  Bone density:  Lipid panel:  No Known Allergies  Current Outpatient Medications  Medication Sig Dispense Refill  . calcium carbonate (CALCIUM 600) 600 MG TABS tablet Take 600 mg by mouth daily.    Marland Kitchen gabapentin (NEURONTIN) 300 MG capsule Take 600 mg by mouth 3 (three) times daily.    Marland Kitchen glucosamine-chondroitin 500-400 MG tablet Take 1 tablet by mouth 3 (three) times daily.    . hydrocortisone  2.5 % cream APPLY TO AFFECTED AREA TWICE A DAY    . ibuprofen (ADVIL) 800 MG tablet Take by mouth.    . Magnesium 500 MG CAPS Take by mouth 3 (three) times daily.    . Multiple Vitamin (MULTIVITAMIN) capsule Take 1 capsule by mouth daily.    Marland Kitchen nystatin cream (MYCOSTATIN) APPLY TO AFFECTED AREA TWICE A DAY    . oxyCODONE-acetaminophen (PERCOCET/ROXICET) 5-325 MG tablet Take 1  tablet by mouth 2 (two) times daily as needed.    Marland Kitchen tiZANidine (ZANAFLEX) 4 MG tablet Take 4 mg by mouth 3 (three) times daily.    Marland Kitchen triamcinolone (KENALOG) 0.1 % paste Apply topically.    . DULoxetine (CYMBALTA) 30 MG capsule Take by mouth.     No current facility-administered medications for this visit.    OBJECTIVE: There were no vitals filed for this visit.   There is no height or weight on file to calculate BMI.    ECOG FS:1 - Symptomatic but completely ambulatory   LAB RESULTS:  No results found for: NA, K, CL, CO2, GLUCOSE, BUN, CREATININE, CALCIUM, PROT, ALBUMIN, AST, ALT, ALKPHOS, BILITOT, GFRNONAA, GFRAA  Lab Results  Component Value Date   WBC 14.1 (H) 10/28/2019   NEUTROABS 7.4 10/28/2019   HGB 15.2 10/28/2019   HCT 46.0 10/28/2019   MCV 96.4 10/28/2019   PLT 226 10/28/2019     STUDIES: No results found.  ASSESSMENT: Leukocytosis  PLAN:    1. Leukocytosis: Patient's white blood cell count remains persistently elevated at 14.1.  Upon further review of his chart, his white blood cell count has been persistently elevated, but stable, since at least December 2017.  Peripheral blood flow cytometry and BCR-ABL mutation are negative.  Unclear etiology of leukocytosis, but likely benign in nature.  No further intervention is needed.  Patient does not require bone marrow biopsy.  Please refer patient back if there are any questions or concerns.   2.  Right axillary infection: Chronic and unchanged.  Unclear if patient may benefit from an I&D.  Continue follow-up and treatment per primary care. 3.  Erectile dysfunction/varicocele/hydrocele: Chronic and unchanged.  Continue follow-up and work-up per urology.  I provided 25 minutes of non face-to-face telephone visit time during this encounter which included chart review. Greater than 50% was spent counseling as documented under my assessment & plan.   Patient expressed understanding and was in agreement with this plan. He  also understands that He can call clinic at any time with any questions, concerns, or complaints.    Lloyd Huger, MD   11/26/2019 2:49 PM

## 2019-11-26 ENCOUNTER — Other Ambulatory Visit: Payer: Self-pay

## 2019-11-26 ENCOUNTER — Inpatient Hospital Stay: Payer: Medicare HMO | Attending: Oncology | Admitting: Oncology

## 2019-11-26 ENCOUNTER — Encounter: Payer: Self-pay | Admitting: Oncology

## 2019-11-26 DIAGNOSIS — D72829 Elevated white blood cell count, unspecified: Secondary | ICD-10-CM

## 2020-06-03 ENCOUNTER — Telehealth: Payer: Self-pay

## 2020-06-03 NOTE — Telephone Encounter (Signed)
Patient called asking if there was any reason for him to make a follow up appt for his issue. He states his symptoms have not changed.

## 2020-06-04 NOTE — Telephone Encounter (Signed)
Chart reviewed. I do not think we have anything to offer him. He could get his PCP to refer for a second opinion if he desires further evaluation

## 2020-06-05 NOTE — Telephone Encounter (Signed)
Patient aware.

## 2020-06-23 ENCOUNTER — Telehealth: Payer: Self-pay | Admitting: *Deleted

## 2020-06-23 ENCOUNTER — Other Ambulatory Visit: Payer: Self-pay | Admitting: *Deleted

## 2020-06-23 NOTE — Telephone Encounter (Signed)
Patient called Triage line had an appointment with his Endocrinologist today Dr. Gershon Crane. He would like to talk to you at your convenience.[phone]

## 2020-07-02 ENCOUNTER — Other Ambulatory Visit: Payer: Self-pay | Admitting: "Endocrinology

## 2020-07-02 DIAGNOSIS — E221 Hyperprolactinemia: Secondary | ICD-10-CM

## 2020-07-03 NOTE — Telephone Encounter (Signed)
Contact number was called and received recording that number was no longer in service

## 2020-07-17 ENCOUNTER — Ambulatory Visit
Admission: RE | Admit: 2020-07-17 | Discharge: 2020-07-17 | Disposition: A | Discharge status: Home / Self Care | Payer: Medicare HMO | Source: Ambulatory Visit | Attending: "Endocrinology | Admitting: "Endocrinology

## 2020-07-17 ENCOUNTER — Other Ambulatory Visit: Payer: Self-pay

## 2020-07-17 DIAGNOSIS — E221 Hyperprolactinemia: Secondary | ICD-10-CM | POA: Diagnosis present

## 2020-07-17 MED ORDER — GADOBUTROL 1 MMOL/ML IV SOLN
10.0000 mL | Freq: Once | INTRAVENOUS | Status: AC | PRN
  Administered 2020-07-17: 10 mL via INTRAVENOUS

## 2020-07-18 ENCOUNTER — Ambulatory Visit: Payer: Medicare HMO

## 2020-10-26 ENCOUNTER — Other Ambulatory Visit: Payer: Self-pay

## 2020-10-26 ENCOUNTER — Encounter: Payer: Self-pay | Admitting: Student in an Organized Health Care Education/Training Program

## 2020-10-26 ENCOUNTER — Ambulatory Visit
Payer: Medicare HMO | Attending: Student in an Organized Health Care Education/Training Program | Admitting: Student in an Organized Health Care Education/Training Program

## 2020-10-26 VITALS — BP 137/84 | HR 70 | Temp 97.0°F | Resp 18 | Ht 70.0 in | Wt 227.0 lb

## 2020-10-26 DIAGNOSIS — Z8781 Personal history of (healed) traumatic fracture: Secondary | ICD-10-CM | POA: Diagnosis present

## 2020-10-26 DIAGNOSIS — Z87828 Personal history of other (healed) physical injury and trauma: Secondary | ICD-10-CM | POA: Diagnosis not present

## 2020-10-26 DIAGNOSIS — G894 Chronic pain syndrome: Secondary | ICD-10-CM | POA: Diagnosis present

## 2020-10-26 DIAGNOSIS — F411 Generalized anxiety disorder: Secondary | ICD-10-CM | POA: Diagnosis not present

## 2020-10-26 DIAGNOSIS — M545 Low back pain, unspecified: Secondary | ICD-10-CM | POA: Insufficient documentation

## 2020-10-26 DIAGNOSIS — M170 Bilateral primary osteoarthritis of knee: Secondary | ICD-10-CM | POA: Diagnosis present

## 2020-10-26 DIAGNOSIS — G8929 Other chronic pain: Secondary | ICD-10-CM | POA: Diagnosis present

## 2020-10-26 DIAGNOSIS — M47816 Spondylosis without myelopathy or radiculopathy, lumbar region: Secondary | ICD-10-CM

## 2020-10-26 DIAGNOSIS — Z720 Tobacco use: Secondary | ICD-10-CM | POA: Diagnosis present

## 2020-10-26 NOTE — Progress Notes (Signed)
Safety precautions to be maintained throughout the outpatient stay will include: orient to surroundings, keep bed in low position, maintain call bell within reach at all times, provide assistance with transfer out of bed and ambulation.  

## 2020-10-26 NOTE — Patient Instructions (Signed)
You will follow through with psych consult prior to next appt. with pain clinic.
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
## 2020-11-02 LAB — COMPLIANCE DRUG ANALYSIS, UR

## 2020-11-05 ENCOUNTER — Telehealth: Payer: Self-pay | Admitting: Student in an Organized Health Care Education/Training Program

## 2020-11-05 NOTE — Telephone Encounter (Signed)
I need to discuss this with Dr Elna Breslow or office manager.

## 2020-11-05 NOTE — Telephone Encounter (Signed)
Dr. Elvera Maria office called stating they called the patient for an appt. But he is unable to do Virtual and so that is the only king of appts. They are doing at this time. Do you have any other suggestions.

## 2020-11-06 NOTE — Telephone Encounter (Signed)
Dr. Elna Breslow   [phone]

## 2020-11-09 ENCOUNTER — Telehealth: Payer: Self-pay

## 2020-11-09 DIAGNOSIS — M170 Bilateral primary osteoarthritis of knee: Secondary | ICD-10-CM

## 2020-11-09 DIAGNOSIS — G894 Chronic pain syndrome: Secondary | ICD-10-CM

## 2020-11-09 DIAGNOSIS — F411 Generalized anxiety disorder: Secondary | ICD-10-CM

## 2020-11-09 NOTE — Telephone Encounter (Signed)
We referred him to Dr. Elna Breslow for his pysc eval and they cant do it because he doesn't have a computer or smart phone to do the virtual visit. Can you put in an order for Dr. Gibson Ramp instead?

## 2020-11-12 ENCOUNTER — Ambulatory Visit: Payer: Medicare HMO | Admitting: Student in an Organized Health Care Education/Training Program

## 2020-11-16 ENCOUNTER — Ambulatory Visit: Payer: Medicare HMO | Admitting: Student in an Organized Health Care Education/Training Program

## 2020-12-09 ENCOUNTER — Telehealth: Payer: Self-pay

## 2020-12-09 NOTE — Telephone Encounter (Signed)
He wants Dr. Cherylann Ratel to call Dr. Saunders Glance office and give them guidance on his pain medicine. He said it was very very very important that he does this because [place] is in [place] and they have messed up his medication dosage. He said he is doing everything he is supposed to, and has the pysc eval on 12/22/20. Dr. Marcello Fennel was giving him his meds until we could start doing it but they have  messed up the script.  Dr. Marcello Fennel is out of the country for 2 weeks.

## 2020-12-09 NOTE — Telephone Encounter (Signed)
Called patient and he states that he is out of medication because script was written wrong by DR Daisy Lazar and now he is [place] of the [place] for two [place]. He wants to know if there is something that he can do. His psych appt is not until 1/18. Please advise

## 2020-12-10 NOTE — Telephone Encounter (Signed)
There should be a covering MD for Dr Marcello Fennel while he is out. Patient has not signed pain contract with [place]. Recommend he reach out to his PCP to cover until he is able to complete his psych eval and come in for his second visit to discuss therapy and sign pain contract

## 2020-12-10 NOTE — Telephone Encounter (Signed)
Called patient to inform him of dr Cherylann Ratel response.Marland Kitchen He states that he got it taken care of and he did see Dr Marcello Fennel head doctor.

## 2021-01-01 ENCOUNTER — Telehealth: Payer: Self-pay

## 2021-01-01 NOTE — Telephone Encounter (Signed)
Patient called requesting to go over his psych eval, get a med refill and an early pick-up.  Patient needs an appointment with Dr Cherylann Ratel.  He has not been prescribed any medications at this point.  Please schedule with Dr Cherylann Ratel.

## 2021-01-06 NOTE — Telephone Encounter (Signed)
Scheduled for 2-8

## 2021-01-12 ENCOUNTER — Ambulatory Visit
Payer: Medicare HMO | Attending: Student in an Organized Health Care Education/Training Program | Admitting: Student in an Organized Health Care Education/Training Program

## 2021-01-12 ENCOUNTER — Other Ambulatory Visit: Payer: Self-pay

## 2021-01-12 ENCOUNTER — Encounter: Payer: Self-pay | Admitting: Student in an Organized Health Care Education/Training Program

## 2021-01-12 VITALS — BP 114/86 | HR 67 | Temp 96.8°F | Resp 18 | Ht 70.0 in | Wt 225.0 lb

## 2021-01-12 DIAGNOSIS — G894 Chronic pain syndrome: Secondary | ICD-10-CM | POA: Insufficient documentation

## 2021-01-12 DIAGNOSIS — M47816 Spondylosis without myelopathy or radiculopathy, lumbar region: Secondary | ICD-10-CM | POA: Diagnosis not present

## 2021-01-12 DIAGNOSIS — Z8781 Personal history of (healed) traumatic fracture: Secondary | ICD-10-CM | POA: Diagnosis not present

## 2021-01-12 DIAGNOSIS — F411 Generalized anxiety disorder: Secondary | ICD-10-CM | POA: Diagnosis present

## 2021-01-12 DIAGNOSIS — Z0289 Encounter for other administrative examinations: Secondary | ICD-10-CM | POA: Insufficient documentation

## 2021-01-12 DIAGNOSIS — M170 Bilateral primary osteoarthritis of knee: Secondary | ICD-10-CM | POA: Diagnosis present

## 2021-01-12 DIAGNOSIS — Z87828 Personal history of other (healed) physical injury and trauma: Secondary | ICD-10-CM | POA: Diagnosis present

## 2021-01-12 MED ORDER — OXYCODONE-ACETAMINOPHEN 10-325 MG PO TABS: 1.0000 | ORAL_TABLET | Freq: Two times a day (BID) | ORAL | 0 refills | Status: DC | PRN

## 2021-01-12 NOTE — Patient Instructions (Signed)
1. Sign pain contract 2. Follow up in 1 month

## 2021-01-12 NOTE — Progress Notes (Signed)
Safety precautions to be maintained throughout the outpatient stay will include: orient to surroundings, keep bed in low position, maintain call bell within reach at all times, provide assistance with transfer out of bed and ambulation.  
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
## 2021-01-14 ENCOUNTER — Telehealth: Payer: Self-pay

## 2021-01-14 NOTE — Telephone Encounter (Signed)
He wants to know if it is ok to split his oxycodone. He didn't make sense in his explanation so please call him to clarify.

## 2021-01-14 NOTE — Telephone Encounter (Signed)
It is ok to split IF the TABLET is scored and safe to split. He usually needs to do this if he is going to do something strenuous  like showering. He knows that he must not take more than prescribed each day.

## 2021-02-01 ENCOUNTER — Encounter: Payer: Medicare HMO | Admitting: Student in an Organized Health Care Education/Training Program

## 2021-02-02 ENCOUNTER — Encounter: Payer: Self-pay | Admitting: Student in an Organized Health Care Education/Training Program

## 2021-02-02 ENCOUNTER — Ambulatory Visit
Payer: Medicare HMO | Attending: Student in an Organized Health Care Education/Training Program | Admitting: Student in an Organized Health Care Education/Training Program

## 2021-02-02 ENCOUNTER — Other Ambulatory Visit: Payer: Self-pay

## 2021-02-02 VITALS — BP 136/89 | HR 88 | Temp 96.9°F | Resp 16 | Ht 70.0 in | Wt 225.0 lb

## 2021-02-02 DIAGNOSIS — F411 Generalized anxiety disorder: Secondary | ICD-10-CM

## 2021-02-02 DIAGNOSIS — Z8781 Personal history of (healed) traumatic fracture: Secondary | ICD-10-CM

## 2021-02-02 DIAGNOSIS — M47816 Spondylosis without myelopathy or radiculopathy, lumbar region: Secondary | ICD-10-CM | POA: Diagnosis not present

## 2021-02-02 DIAGNOSIS — G894 Chronic pain syndrome: Secondary | ICD-10-CM | POA: Diagnosis not present

## 2021-02-02 DIAGNOSIS — M170 Bilateral primary osteoarthritis of knee: Secondary | ICD-10-CM

## 2021-02-02 DIAGNOSIS — Z0289 Encounter for other administrative examinations: Secondary | ICD-10-CM | POA: Diagnosis present

## 2021-02-02 DIAGNOSIS — Z87828 Personal history of other (healed) physical injury and trauma: Secondary | ICD-10-CM | POA: Diagnosis present

## 2021-02-02 DIAGNOSIS — Z720 Tobacco use: Secondary | ICD-10-CM | POA: Diagnosis not present

## 2021-02-02 MED ORDER — GABAPENTIN 300 MG PO CAPS: 600.0000 mg | ORAL_CAPSULE | Freq: Three times a day (TID) | ORAL | 5 refills | Status: DC

## 2021-02-02 MED ORDER — OXYCODONE-ACETAMINOPHEN 10-325 MG PO TABS: 1.0000 | ORAL_TABLET | Freq: Two times a day (BID) | ORAL | 0 refills | Status: DC | PRN

## 2021-02-02 MED ORDER — TIZANIDINE HCL 4 MG PO TABS
4.0000 mg | ORAL_TABLET | Freq: Three times a day (TID) | ORAL | 2 refills | Status: DC | PRN
Start: 2021-02-02 — End: 2021-02-04

## 2021-02-02 MED ORDER — IBUPROFEN 800 MG PO TABS: 800.0000 mg | ORAL_TABLET | Freq: Two times a day (BID) | ORAL | 2 refills | Status: DC | PRN

## 2021-02-02 NOTE — Progress Notes (Signed)
Nursing Pain Medication Assessment:  Safety precautions to be maintained throughout the outpatient stay will include: orient to surroundings, keep bed in low position, maintain call bell within reach at all times, provide assistance with transfer out of bed and ambulation.  Medication Inspection Compliance: Mr. Lindeman did not comply with our request to bring his pills to be counted. He was reminded that bringing the medication bottles, even when empty, is a requirement.  Medication: None brought in. Pill/Patch Count: None available to be counted. Bottle Appearance: No container available. Did not bring bottle(s) to appointment. Filled Date: N/A Last Medication intake:  Today
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
## 2021-02-03 ENCOUNTER — Other Ambulatory Visit: Payer: Self-pay | Admitting: *Deleted

## 2021-02-03 ENCOUNTER — Telehealth: Payer: Self-pay | Admitting: Student in an Organized Health Care Education/Training Program

## 2021-02-03 MED ORDER — OXYCODONE-ACETAMINOPHEN 10-325 MG PO TABS: 1.0000 | ORAL_TABLET | Freq: Two times a day (BID) | ORAL | 0 refills | Status: DC | PRN

## 2021-02-03 NOTE — Telephone Encounter (Signed)
Please call pharmacy and cancel old prescription.  Please document this.  I will send a new one to [facility].

## 2021-02-03 NOTE — Telephone Encounter (Signed)
Patient states scripts were sent to the wrong [facility]). [facility] [place] [place]. Please let patient know when he can go pick these up.

## 2021-02-03 NOTE — Telephone Encounter (Signed)
Message sent to Dr Lateef 

## 2021-02-04 MED ORDER — TIZANIDINE HCL 4 MG PO TABS: 4.0000 mg | ORAL_TABLET | Freq: Three times a day (TID) | ORAL | 2 refills | Status: DC | PRN

## 2021-02-04 MED ORDER — IBUPROFEN 800 MG PO TABS: 800.0000 mg | ORAL_TABLET | Freq: Two times a day (BID) | ORAL | 2 refills | Status: DC | PRN

## 2021-02-04 NOTE — Telephone Encounter (Signed)
Called to pharmacy and cancelled medications that were sent in 0301/22.  I did call the patient and confirm that he needed [facility] [address] and he confirms that this is his regular pharmacy and would like that to happen.  I will remind Dr Cherylann Ratel to also resend Gabapentin, ibuprofen and zanaflex to the requested pharmacy.

## 2021-02-04 NOTE — Addendum Note (Signed)
Addended by: Edward Jolly on: 02/04/2021 03:39 PM   Modules accepted: Orders

## 2021-03-09 ENCOUNTER — Other Ambulatory Visit: Payer: Self-pay

## 2021-03-09 ENCOUNTER — Encounter: Payer: Self-pay | Admitting: Student in an Organized Health Care Education/Training Program

## 2021-03-09 ENCOUNTER — Ambulatory Visit
Payer: Medicare HMO | Attending: Student in an Organized Health Care Education/Training Program | Admitting: Student in an Organized Health Care Education/Training Program

## 2021-03-09 VITALS — BP 171/91 | HR 68 | Temp 97.5°F | Resp 18 | Ht 70.0 in | Wt 225.0 lb

## 2021-03-09 DIAGNOSIS — F411 Generalized anxiety disorder: Secondary | ICD-10-CM | POA: Insufficient documentation

## 2021-03-09 DIAGNOSIS — G894 Chronic pain syndrome: Secondary | ICD-10-CM | POA: Diagnosis present

## 2021-03-09 DIAGNOSIS — Z87828 Personal history of other (healed) physical injury and trauma: Secondary | ICD-10-CM | POA: Insufficient documentation

## 2021-03-09 DIAGNOSIS — Z8781 Personal history of (healed) traumatic fracture: Secondary | ICD-10-CM | POA: Insufficient documentation

## 2021-03-09 DIAGNOSIS — M170 Bilateral primary osteoarthritis of knee: Secondary | ICD-10-CM | POA: Diagnosis not present

## 2021-03-09 DIAGNOSIS — Z0289 Encounter for other administrative examinations: Secondary | ICD-10-CM | POA: Insufficient documentation

## 2021-03-09 DIAGNOSIS — M47816 Spondylosis without myelopathy or radiculopathy, lumbar region: Secondary | ICD-10-CM | POA: Insufficient documentation

## 2021-03-09 MED ORDER — OXYCODONE-ACETAMINOPHEN 10-325 MG PO TABS: 1.0000 | ORAL_TABLET | Freq: Two times a day (BID) | ORAL | 0 refills | Status: DC | PRN

## 2021-03-09 MED ORDER — OXYCODONE-ACETAMINOPHEN 10-325 MG PO TABS: 1.0000 | ORAL_TABLET | Freq: Two times a day (BID) | ORAL | 0 refills | Status: AC | PRN

## 2021-03-09 MED ORDER — GABAPENTIN 300 MG PO CAPS: 600.0000 mg | ORAL_CAPSULE | Freq: Four times a day (QID) | ORAL | 5 refills | Status: DC
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
## 2021-03-09 NOTE — Progress Notes (Signed)
Nursing Pain Medication Assessment:  Safety precautions to be maintained throughout the outpatient stay will include: orient to surroundings, keep bed in low position, maintain call bell within reach at all times, provide assistance with transfer out of bed and ambulation.  Medication Inspection Compliance: Pill count conducted under aseptic conditions, in front of the patient. Neither the pills nor the bottle was removed from the patient's sight at any time. Once count was completed pills were immediately returned to the patient in their original bottle.  Medication: Oxycodone/APAP Pill/Patch Count: 9 of 60 pills remain Pill/Patch Appearance: Markings consistent with prescribed medication Bottle Appearance: Standard pharmacy container. Clearly labeled. Filled Date: 03 / 10 / 2022 Last Medication intake:  Today

## 2021-04-27 ENCOUNTER — Encounter: Payer: Medicare HMO | Admitting: Student in an Organized Health Care Education/Training Program

## 2021-04-29 ENCOUNTER — Encounter: Payer: Medicare HMO | Admitting: Student in an Organized Health Care Education/Training Program

## 2021-05-04 ENCOUNTER — Other Ambulatory Visit: Payer: Self-pay | Admitting: Student in an Organized Health Care Education/Training Program

## 2021-05-05 NOTE — Telephone Encounter (Addendum)
Patient called back requesting some advice.  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   He is wanting to know if Dr. Lonna Cobb ever talked to his endocrinologist, Dr. Gershon Crane.  I looked up the number and [phone].  Patient believes that there is some type of medication that can help him.  Should we make a follow up appointment.  Okay to leave a message on patient's voicemail.  He can be reached at [phone].

## 2021-05-06 NOTE — Telephone Encounter (Signed)
I am not aware of any medication that would treat this problem. His testosterone level was normal. He is on duloxetine which can cause or aggravate this problem. Would recc he discuss with the provider who prescibes this med to see if there may be an alternative.

## 2021-05-10 ENCOUNTER — Other Ambulatory Visit: Payer: Self-pay

## 2021-05-10 ENCOUNTER — Ambulatory Visit
Payer: Medicare HMO | Attending: Student in an Organized Health Care Education/Training Program | Admitting: Student in an Organized Health Care Education/Training Program

## 2021-05-10 ENCOUNTER — Encounter: Payer: Self-pay | Admitting: Student in an Organized Health Care Education/Training Program

## 2021-05-10 VITALS — BP 150/81 | HR 92 | Temp 97.1°F | Resp 16 | Ht 70.0 in | Wt 225.0 lb

## 2021-05-10 DIAGNOSIS — M170 Bilateral primary osteoarthritis of knee: Secondary | ICD-10-CM | POA: Diagnosis present

## 2021-05-10 DIAGNOSIS — Z8781 Personal history of (healed) traumatic fracture: Secondary | ICD-10-CM | POA: Diagnosis present

## 2021-05-10 DIAGNOSIS — Z0289 Encounter for other administrative examinations: Secondary | ICD-10-CM | POA: Diagnosis present

## 2021-05-10 DIAGNOSIS — G894 Chronic pain syndrome: Secondary | ICD-10-CM

## 2021-05-10 DIAGNOSIS — M47816 Spondylosis without myelopathy or radiculopathy, lumbar region: Secondary | ICD-10-CM | POA: Diagnosis present

## 2021-05-10 DIAGNOSIS — F411 Generalized anxiety disorder: Secondary | ICD-10-CM | POA: Insufficient documentation

## 2021-05-10 MED ORDER — OXYCODONE-ACETAMINOPHEN 5-325 MG PO TABS: 1.0000 | ORAL_TABLET | Freq: Four times a day (QID) | ORAL | 0 refills | Status: DC | PRN

## 2021-05-10 MED ORDER — OXYCODONE-ACETAMINOPHEN 5-325 MG PO TABS: 1.0000 | ORAL_TABLET | Freq: Four times a day (QID) | ORAL | 0 refills | Status: AC | PRN

## 2021-05-10 NOTE — Progress Notes (Signed)
Nursing Pain Medication Assessment:  Safety precautions to be maintained throughout the outpatient stay will include: orient to surroundings, keep bed in low position, maintain call bell within reach at all times, provide assistance with transfer out of bed and ambulation.  Medication Inspection Compliance: Pill count conducted under aseptic conditions, in front of the patient. Neither the pills nor the bottle was removed from the patient's sight at any time. Once count was completed pills were immediately returned to the patient in their original bottle.  Medication: Oxycodone/APAP Pill/Patch Count: 4 of 60 pills remain Pill/Patch Appearance: Markings consistent with prescribed medication Bottle Appearance: Standard pharmacy container. Clearly labeled. Filled Date: 05 / 09 / 2022 Last Medication intake:  Today

## 2021-05-10 NOTE — Progress Notes (Signed)
PROVIDER NOTE: Information contained herein reflects review and annotations entered in association with encounter. Interpretation of such information and data should be left to medically-trained personnel. Information provided to patient can be located elsewhere in the medical record under "Patient Instructions". Document created using STT-dictation technology, any transcriptional errors that may result from process are unintentional.    [name]  Service Category: E/M  Provider: Gillis Santa, MD  DOB: [birth year]  DOS: 05/10/2021  Specialty: Interventional Pain Management  MRN: [id number]  Setting: Ambulatory outpatient  PCP: Christopher Harrier, MD  Type: Established Patient    Referring Provider: Tracie Harrier, MD  Location: Office  Delivery: Face-to-face     HPI  [name], a 60 y.o. year old male, is here today because of his Chronic pain syndrome [G89.4]. [name] primary complain today is Back Pain (Lumbar midline), Knee Pain (Bilateral ), Pelvic Pain (S/p fx. ), and Neck Pain (Bilateral ) Last encounter: My last encounter with him was on 02/03/2021. Pertinent problems: [name] has History of pelvic fracture; Chronic midline low back pain without sciatica; Tobacco user; Lumbar facet arthropathy; Chronic pain syndrome; Bilateral primary osteoarthritis of knee; and Pain management contract signed on their pertinent problem list. Pain Assessment: Severity of Chronic pain is reported as a 2 /10. Location: Back (see visit info for additional sites.) Lower,Left,Right,Upper,Mid/knee pain can go up the leg and down the leg.  back pain into pelvic region. Onset: More than a month ago. Quality: Discomfort,Constant,Dull,Aching,Sharp,Pressure. Timing: Constant. Modifying factor(s): exercising once he gets going. Vitals:  height is 5' 10"  (1.778 m) and weight is 225 lb (102.1 kg). His temporal temperature is 97.1 F (36.2 C) (abnormal). His blood pressure is 150/81 (abnormal) and his  pulse is 92. His respiration is 16 and oxygen saturation is 97%.   Reason for encounter: medication management.    [name] presents today for medication refill.  He has been utilizing his Percocet twice a day fairly regularly.  He states that this helps manage his pain.  He is utilizing ibuprofen 800 mg twice a day and is also supplementing with Tylenol 500 mg 3 times daily as needed.  He is requesting an increase in his Percocet which I advised against.  We will change his Percocet from 10 mg to 5 mg so that he can take that every 6 hours as needed to see if that has an impact.  His total daily dose will remain the same with an MME of 30.  I informed the patient that we will not be escalating his dose beyond this.  He is instructed to continue gabapentin as prescribed.  Pharmacotherapy Assessment   Analgesic: Percocet 10 mg twice daily as needed, quantity 60/month; MME equals 30    Monitoring: Christopher Mcneil PMP: PDMP reviewed during this encounter.       Pharmacotherapy: No side-effects or adverse reactions reported. Compliance: No problems identified. Effectiveness: Clinically acceptable.  Christopher Billow, RN  05/10/2021 12:38 PM  Sign when Signing Visit Nursing Pain Medication Assessment:  Safety precautions to be maintained throughout the outpatient stay will include: orient to surroundings, keep bed in low position, maintain call bell within reach at all times, provide assistance with transfer out of bed and ambulation.  Medication Inspection Compliance: Pill count conducted under aseptic conditions, in front of the patient. Neither the pills nor the bottle was removed from the patient's sight at any time. Once count was completed pills were immediately returned to the patient in their original bottle.  Medication: Oxycodone/APAP Pill/Patch Count: 4 of 60 pills remain Pill/Patch Appearance: Markings consistent with prescribed medication Bottle Appearance: Standard pharmacy container. Clearly  labeled. Filled Date: 05 / 09 / 2022 Last Medication intake:  Today    UDS:  Summary  Date Value Ref Range Status  10/26/2020 Note  Final    Comment:    ==================================================================== Compliance Drug Analysis, Ur ==================================================================== Test                             Result       Flag       Units  Drug Present and Declared for Prescription Verification   Oxycodone                      1102         EXPECTED   ng/mg creat   Oxymorphone                    1361         EXPECTED   ng/mg creat   Noroxycodone                   989          EXPECTED   ng/mg creat   Noroxymorphone                 164          EXPECTED   ng/mg creat    Sources of oxycodone are scheduled prescription medications.    Oxymorphone, noroxycodone, and noroxymorphone are expected    metabolites of oxycodone. Oxymorphone is also available as a    scheduled prescription medication.    Gabapentin                     PRESENT      EXPECTED   Tizanidine                     PRESENT      EXPECTED   Acetaminophen                  PRESENT      EXPECTED   Ibuprofen                      PRESENT      EXPECTED ==================================================================== Test                      Result    Flag   Units      Ref Range   Creatinine              180              mg/dL      >=20 ==================================================================== Declared Medications:  The flagging and interpretation on this report are based on the  following declared medications.  Unexpected results may arise from  inaccuracies in the declared medications.   **Note: The testing scope of this panel includes these medications:   Gabapentin (Neurontin)  Oxycodone   **Note: The testing scope of this panel does not include small to  moderate amounts of these reported medications:   Acetaminophen  Ibuprofen  Tizanidine   **Note: The  testing scope of this panel does not include the  following reported medications:   Hydrocortisone  Magnesium  Multivitamin ==================================================================== For clinical consultation, please  call [phone]. ====================================================================      ROS  Constitutional: Denies any fever or chills Gastrointestinal: No reported hemesis, hematochezia, vomiting, or acute GI distress Musculoskeletal: +LBP Neurological: No reported episodes of acute onset apraxia, aphasia, dysarthria, agnosia, amnesia, paralysis, loss of coordination, or loss of consciousness  Medication Review  Glucosamine-Chondroitin-MSM, Magnesium, acetaminophen, budesonide-formoterol, gabapentin, hydrocortisone, ibuprofen, multivitamin, nystatin cream, oxyCODONE-acetaminophen, and tiZANidine  History Review  Allergy: [name] is allergic to amoxicillin. Drug: [name]  reports no history of drug use. Alcohol:  has no history on file for alcohol use. Tobacco:  reports that he has been smoking. He has been smoking about 2.00 packs per day. He uses smokeless tobacco. Social: [name]  reports that he has been smoking. He has been smoking about 2.00 packs per day. He uses smokeless tobacco. He reports that he does not use drugs. Medical:  has a past medical history of Folliculitis, GAD (generalized anxiety disorder), History of fracture of pelvis, Hypertriglyceridemia, Medical history reviewed with no changes, and Sciatic nerve pain. Surgical: [name]  has a past surgical history that includes Knee surgery (Bilateral) and Bony pelvis surgery (Left). Family: family history is not on file.  Laboratory Chemistry Profile   Renal No results found for: BUN, CREATININE, LABCREA, BCR, GFR, GFRAA, GFRNONAA, LABVMA, EPIRU, EPINEPH24HUR, NOREPRU, NOREPI24HUR, DOPARU, [id number]   Hepatic No results found for: AST, ALT, ALBUMIN, ALKPHOS, HCVAB, AMYLASE,  LIPASE, AMMONIA   Electrolytes No results found for: NA, K, CL, CALCIUM, MG, PHOS   Bone Lab Results  Component Value Date   TESTOSTERONE 304 10/02/2019     Inflammation (CRP: Acute Phase) (ESR: Chronic Phase) No results found for: CRP, ESRSEDRATE, LATICACIDVEN     Note: Above Lab results reviewed.  Recent Imaging Review  MR BRAIN W WO CONTRAST CLINICAL DATA:  Low testosterone, elevated prolactin  EXAM: MRI HEAD WITHOUT AND WITH CONTRAST  TECHNIQUE: Multiplanar, multiecho pulse sequences of the brain and surrounding structures were obtained without and with intravenous contrast.  CONTRAST:  79m GADAVIST GADOBUTROL 1 MMOL/ML IV SOLN  COMPARISON:  None.  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 There is no sellar or suprasellar mass. Pituitary infundibulum is midline. There is no acute infarction or intracranial hemorrhage. There is no intracranial mass, mass effect, or edema. There is no hydrocephalus or extra-axial fluid collection. Prominence of the ventricles and sulci reflects mild generalized parenchymal volume loss. Patchy T2 hyperintensity in the supratentorial white matter is nonspecific but may reflect mild chronic microvascular ischemic changes. No abnormal enhancement.  Vascular: Major vessel flow voids at the skull base are preserved.  Skull and upper cervical spine: Normal marrow signal is preserved.  Sinuses/Orbits: Mild to moderate paranasal sinus mucosal thickening with some aerosolized secretions in the right maxillary sinus. Orbits are unremarkable.  Other: Left mastoid tip fluid opacification.  IMPRESSION: No sellar or suprasellar mass.  Mild chronic microvascular ischemic changes and cerebral volume loss.  Electronically Signed   By: PMacy MisM.D.   On: 07/18/2020 13:59 Note: Reviewed        Physical Exam  General appearance: Well nourished, well developed, and well hydrated. In no apparent acute distress Mental  status: Alert, oriented x 3 (person, place, & time)       Respiratory: No evidence of acute respiratory distress Eyes: PERLA Vitals: BP (!) 150/81 (BP Location: Right Arm, Patient Position: Sitting, Cuff Size: Large)   Pulse 92   Temp (!) 97.1 F (36.2 C) (Temporal)   Resp  16   Ht 5' 10"  (1.778 m)   Wt 225 lb (102.1 kg)   SpO2 97%   BMI 32.28 kg/m  BMI: Estimated body mass index is 32.28 kg/m as calculated from the following:   Height as of this encounter: 5' 10"  (1.778 m).   Weight as of this encounter: 225 lb (102.1 kg). Ideal: Ideal body weight: 73 kg (160 lb 15 oz) Adjusted ideal body weight: 84.6 kg (186 lb 9 oz)   Lumbar Exam  Skin & Axial Inspection:lumbar support brace in place Alignment:Symmetrical Functional WNI:OEVO restricted ROM Stability:No instability detected Muscle Tone/Strength:Functionally intact. No obvious neuro-muscular anomalies detected. Sensory (Neurological):Musculoskeletal pain pattern Palpation:No palpable anomalies Provocative Tests: Hyperextension/rotation test:(+)bilaterally for facet joint pain. Lumbar quadrant test (Kemp's test):(+)bilaterally for facet joint pain.  Gait & Posture Assessment  Ambulation:Unassisted Gait:Relatively normal for age and body habitus Posture:WNL  Lower Extremity Exam    Side:Right lower extremity  Side:Left lower extremity  Stability:No instability observed  Stability:No instability observed  Skin & Extremity Inspection:Evidence of prior arthroplastic surgery  Skin & Extremity Inspection:Evidence of prior arthroplastic surgery  Functional JJK:KXFG restricted ROMfor hip and knee joints   Functional HWE:XHBZ restricted ROMfor hip and knee joints   Muscle Tone/Strength:Functionally intact. No obvious neuro-muscular anomalies detected.  Muscle Tone/Strength:Functionally intact. No obvious neuro-muscular anomalies detected.  Sensory  (Neurological):Musculoskeletal pain pattern  Sensory (Neurological):Musculoskeletal pain pattern  DTR: Patellar:deferred today Achilles:deferred today Plantar:deferred today  DTR: Patellar:deferred today Achilles:deferred today Plantar:deferred today  Palpation:No palpable anomalies  Palpation:No palpable anomalies     Assessment   Status Diagnosis  Controlled Controlled Controlled 1. Chronic pain syndrome   2. Lumbar facet arthropathy   3. History of pelvic fracture   4. Bilateral primary osteoarthritis of knee   5. GAD (generalized anxiety disorder)   6. Pain management contract signed      Plan of Care  [name] has a current medication list which includes the following long-term medication(s): budesonide-formoterol and gabapentin.  Pharmacotherapy (Medications Ordered): Meds ordered this encounter  Medications  . oxyCODONE-acetaminophen (PERCOCET) 5-325 MG tablet    Sig: Take 1 tablet by mouth every 6 (six) hours as needed for severe pain. Must last 30 days.    Dispense:  120 tablet    Refill:  0    Chronic Pain. (STOP Act - Not applicable). Fill one day early if closed on scheduled refill date.  Marland Kitchen oxyCODONE-acetaminophen (PERCOCET) 5-325 MG tablet    Sig: Take 1 tablet by mouth every 6 (six) hours as needed for severe pain. Must last 30 days.    Dispense:  120 tablet    Refill:  0    Chronic Pain. (STOP Act - Not applicable). Fill one day early if closed on scheduled refill date.  Continue gabapentin 600 mg 4 times daily, Tylenol 500 mg 3 times daily as needed, ibuprofen 800 mg twice daily as needed.  Patient cautioned on renal health with chronic NSAID use.  Follow-up plan:   Return in about 8 weeks (around 07/08/2021) for Medication Management, in person.      Interventional management options:  Considering:   Diagnostic lumbar facet medial branch nerve blocks   PRN Procedures:   None at this time      Recent Visits Date  Type Provider Dept  03/09/21 Office Visit Christopher Santa, MD [facility]  Showing recent visits within past 90 days and meeting all other requirements Today's Visits Date Type Provider Dept  05/10/21 Office Visit Christopher Santa, MD  [facility]  Showing today's visits and meeting all other requirements Future Appointments No visits were found meeting these conditions. Showing future appointments within next 90 days and meeting all other requirements  I discussed the assessment and treatment plan with the patient. The patient was provided an opportunity to ask questions and all were answered. The patient agreed with the plan and demonstrated an understanding of the instructions.  Patient advised to call back or seek an in-person evaluation if the symptoms or condition worsens.  Duration of encounter: 79mnutes.  Note by: BGillis Santa MD Date: 05/10/2021; Time: 1:12 PM

## 2021-05-10 NOTE — Telephone Encounter (Signed)
Tried to call patient a couple of time. His voicemail is full and cant leave a message,

## 2021-06-23 IMAGING — MR MR HEAD WO/W CM
12 of 18 series · 27 of 48 positions shown · IV contrast (10ml Gadavist)
Comparison: None.

CLINICAL DATA: Low testosterone, elevated prolactin

EXAM:
MRI HEAD WITHOUT AND WITH CONTRAST
TECHNIQUE: Multiplanar, multiecho pulse sequences of the brain and surrounding
structures were obtained without and with intravenous contrast.
CONTRAST:  10mL GADAVIST GADOBUTROL 1 MMOL/ML IV SOLN

[Series 5: T1 · sagittal · 5.0mm · 0.62mm/px · 2 of 23 slices shown]
[im 1/23]
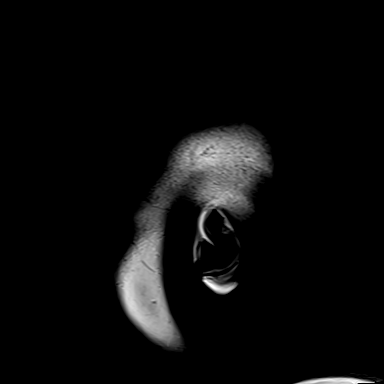
[im 12/23]
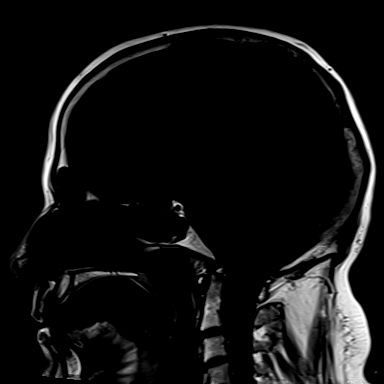

[Series 8: T2 · axial · 5.0mm · 0.53mm/px · z∈[-62,+94]mm · 2 of 27 slices shown]
[im 1/27]
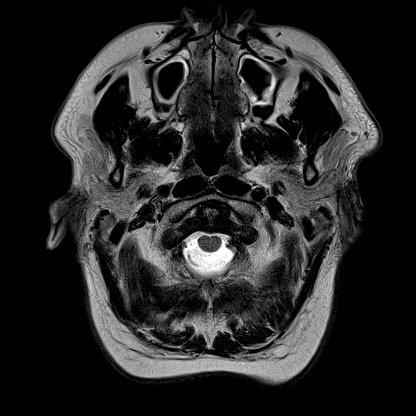
[im 27/27]
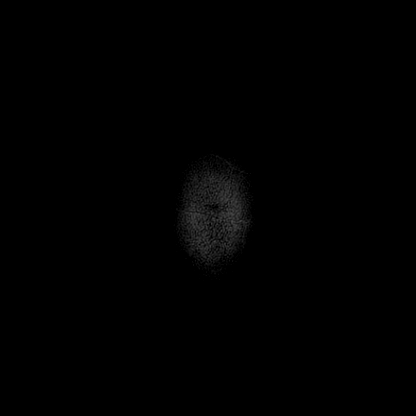

[Series 10: FLAIR · axial · 3.0mm · 0.53mm/px · z∈[-65,+97]mm · 5 of 55 slices shown]
[im 1/55]
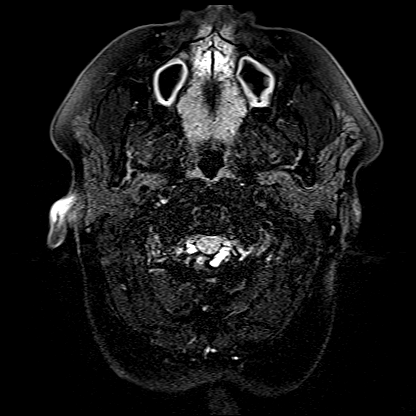
[im 14/55]
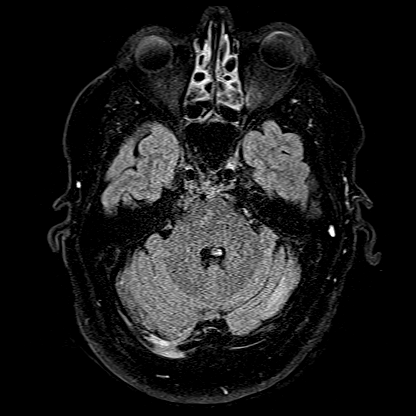
[im 28/55]
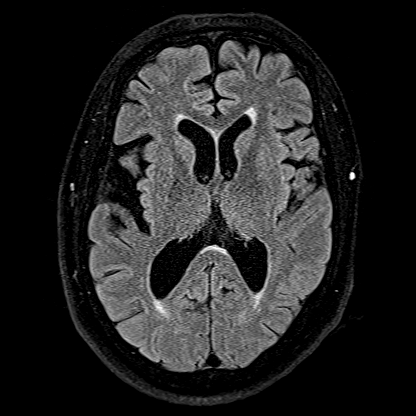
[im 41/55]
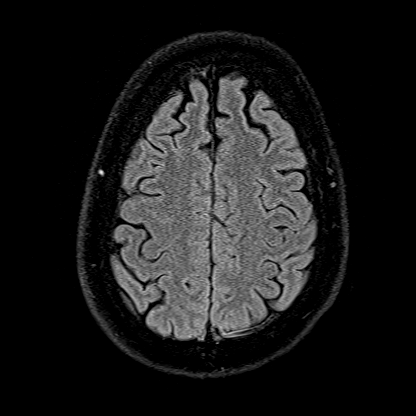
[im 55/55]
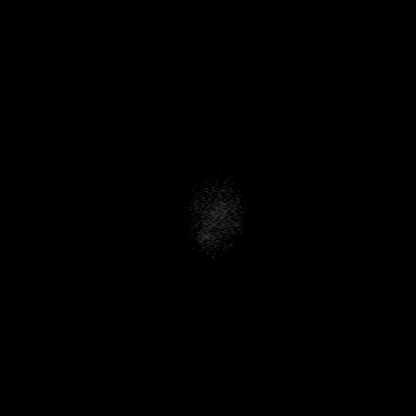

[Series 14: T1 post-contrast · coronal · 3.0mm · 0.28mm/px · 1 of 11 slices shown (1 of 9)]
[im 1/11]
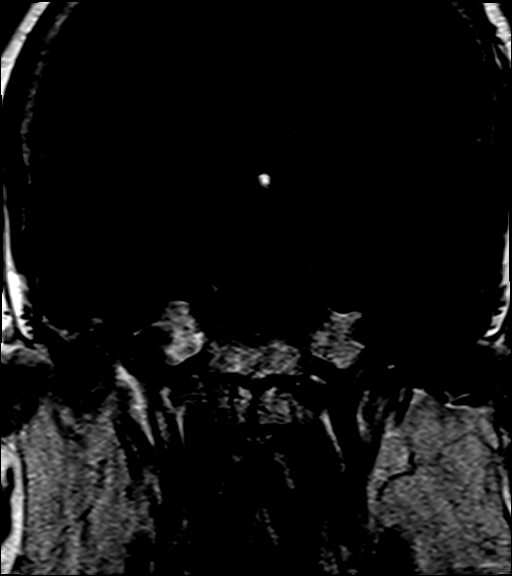

[Series 15: T1 post-contrast · coronal · 3.0mm · 0.28mm/px · 1 of 11 slices shown (2 of 9)]
[im 1/11]
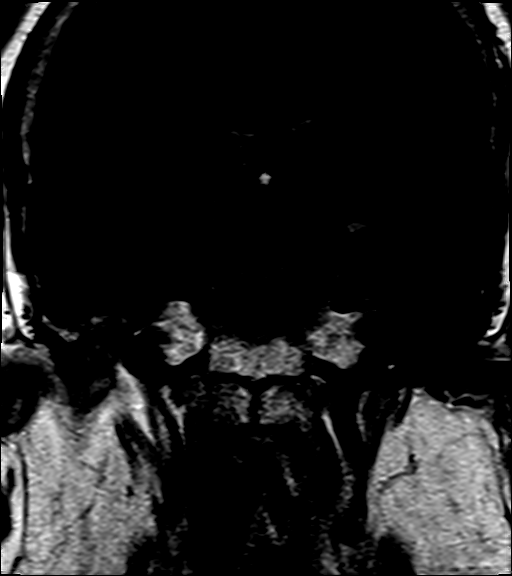

[Series 16: T1 post-contrast · coronal · 3.0mm · 0.28mm/px · 1 of 11 slices shown (3 of 9)]
[im 1/11]
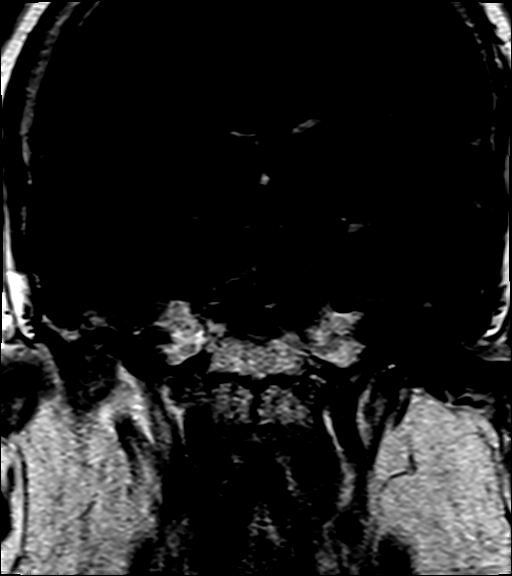

[Series 17: T1 post-contrast · coronal · 3.0mm · 0.28mm/px · 1 of 11 slices shown (4 of 9)]
[im 1/11]
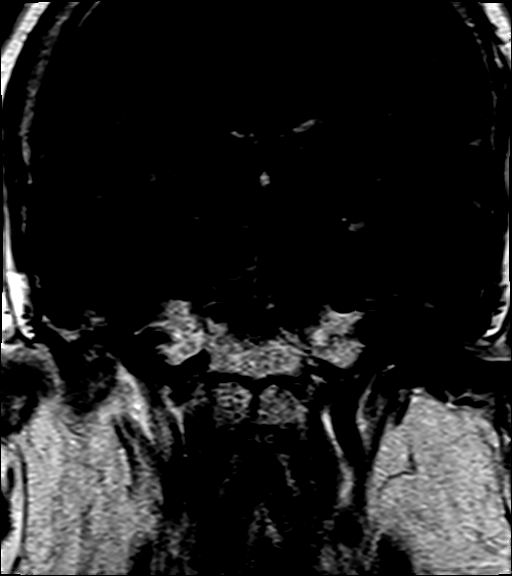

[Series 18: T1 post-contrast · coronal · 3.0mm · 0.28mm/px · 1 of 11 slices shown (5 of 9)]
[im 1/11]
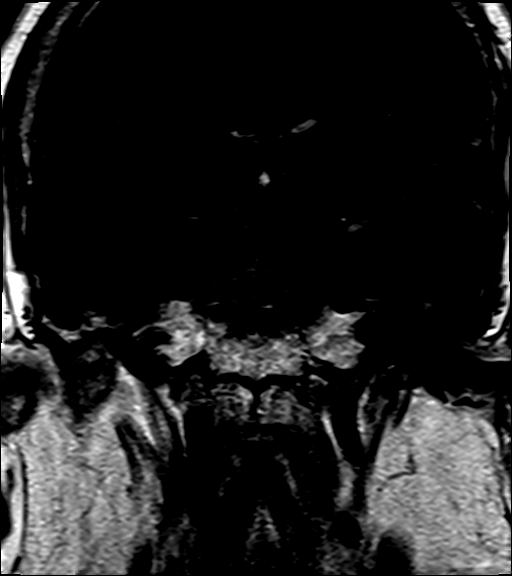

[Series 19: T1 post-contrast · coronal · 3.0mm · 0.21mm/px · 1 of 13 slices shown (6 of 9)]
[im 1/13]
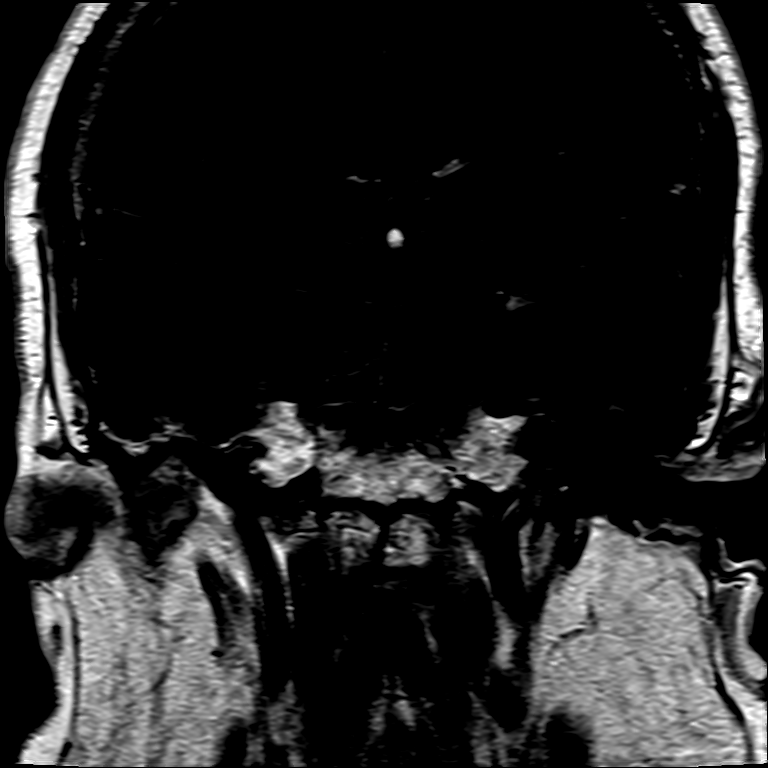

[Series 20: T1 post-contrast · sagittal · 3.0mm · 0.21mm/px · 1 of 13 slices shown (7 of 9)]
[im 1/13]
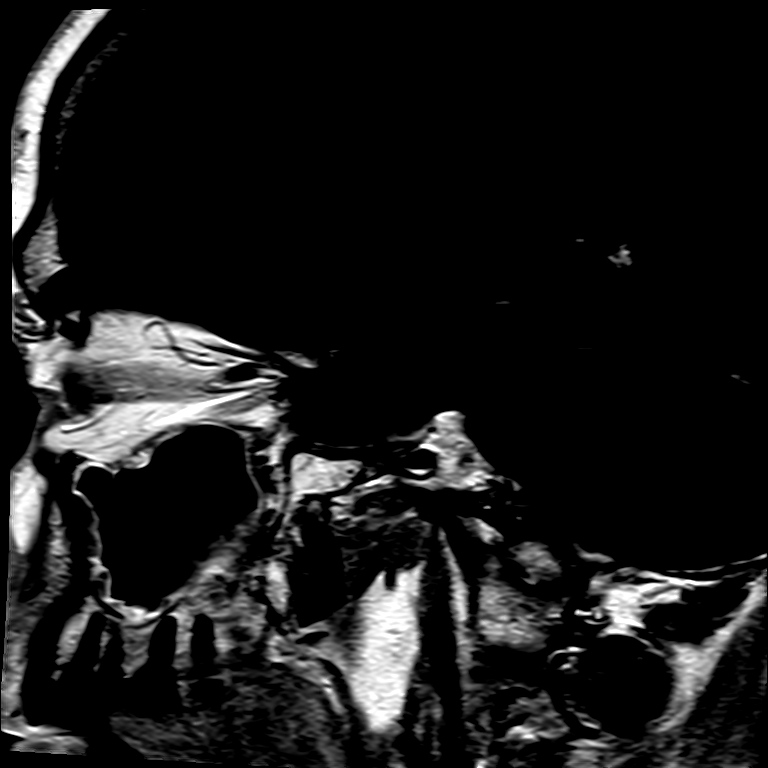

[Series 21: T1 post-contrast · axial · 1.0mm · 0.98mm/px · z∈[-71,+104]mm · 8 of 176 slices shown (8 of 9)]
[im 1/176]
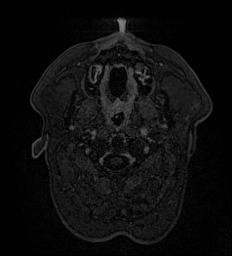
[im 26/176]
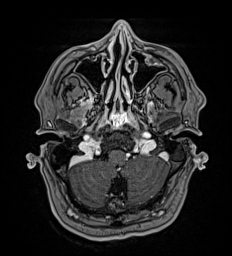
[im 51/176]
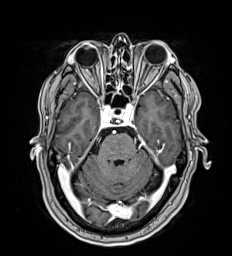
[im 76/176]
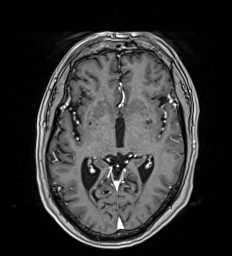
[im 101/176]
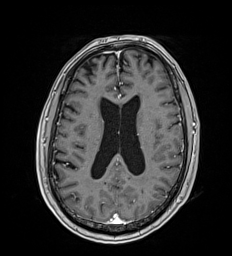
[im 126/176]
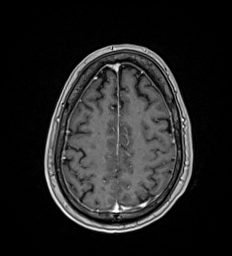
[im 151/176]
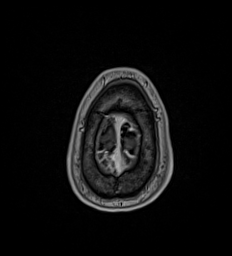
[im 176/176]
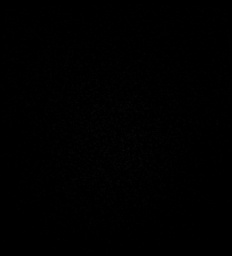

[Series 22: T1 post-contrast · coronal · 5.0mm · 0.57mm/px · 3 of 29 slices shown (9 of 9)]
[im 1/29]
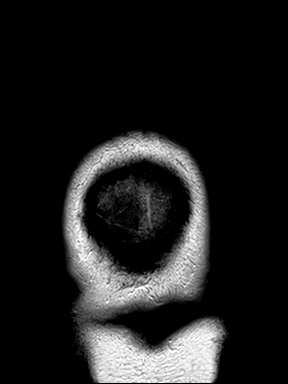
[im 15/29]
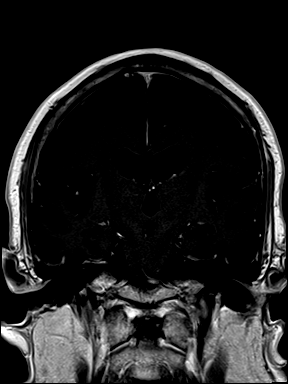
[im 29/29]
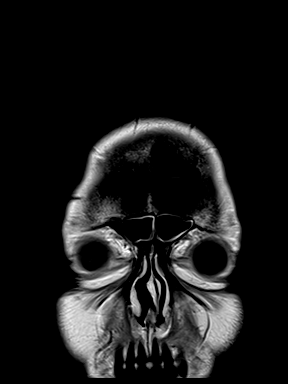

[27 of 48 positions shown; findings below may reference images not displayed]

FINDINGS: Brain: There is a partially empty sella. There is no sellar or
suprasellar mass. Pituitary infundibulum is midline. There is no
acute infarction or intracranial hemorrhage. There is no
intracranial mass, mass effect, or edema. There is no hydrocephalus
or extra-axial fluid collection. Prominence of the ventricles and
sulci reflects mild generalized parenchymal volume loss. Patchy T2
hyperintensity in the supratentorial white matter is nonspecific but
may reflect mild chronic microvascular ischemic changes. No abnormal
enhancement.

Vascular: Major vessel flow voids at the skull base are preserved.

Skull and upper cervical spine: Normal marrow signal is preserved.

Sinuses/Orbits: Mild to moderate paranasal sinus mucosal thickening
with some aerosolized secretions in the right maxillary sinus.
Orbits are unremarkable.

Other: Left mastoid tip fluid opacification.
IMPRESSION: No sellar or suprasellar mass.

Mild chronic microvascular ischemic changes and cerebral volume
loss.

## 2021-07-08 ENCOUNTER — Telehealth: Payer: Self-pay | Admitting: *Deleted

## 2021-07-08 ENCOUNTER — Encounter: Payer: Self-pay | Admitting: Student in an Organized Health Care Education/Training Program

## 2021-07-08 ENCOUNTER — Other Ambulatory Visit: Payer: Self-pay

## 2021-07-08 ENCOUNTER — Ambulatory Visit
Payer: Medicare HMO | Attending: Student in an Organized Health Care Education/Training Program | Admitting: Student in an Organized Health Care Education/Training Program

## 2021-07-08 VITALS — BP 127/93 | HR 83 | Temp 97.2°F | Resp 16 | Ht 62.0 in | Wt 225.0 lb

## 2021-07-08 DIAGNOSIS — G894 Chronic pain syndrome: Secondary | ICD-10-CM

## 2021-07-08 DIAGNOSIS — M47816 Spondylosis without myelopathy or radiculopathy, lumbar region: Secondary | ICD-10-CM | POA: Diagnosis not present

## 2021-07-08 DIAGNOSIS — Z8781 Personal history of (healed) traumatic fracture: Secondary | ICD-10-CM | POA: Diagnosis not present

## 2021-07-08 DIAGNOSIS — Z720 Tobacco use: Secondary | ICD-10-CM

## 2021-07-08 DIAGNOSIS — M170 Bilateral primary osteoarthritis of knee: Secondary | ICD-10-CM

## 2021-07-08 DIAGNOSIS — Z87828 Personal history of other (healed) physical injury and trauma: Secondary | ICD-10-CM

## 2021-07-08 DIAGNOSIS — Z0289 Encounter for other administrative examinations: Secondary | ICD-10-CM

## 2021-07-08 DIAGNOSIS — F411 Generalized anxiety disorder: Secondary | ICD-10-CM

## 2021-07-08 MED ORDER — OXYCODONE-ACETAMINOPHEN 5-325 MG PO TABS: 1.0000 | ORAL_TABLET | Freq: Four times a day (QID) | ORAL | 0 refills | Status: AC | PRN

## 2021-07-08 MED ORDER — OXYCODONE-ACETAMINOPHEN 5-325 MG PO TABS: 1.0000 | ORAL_TABLET | Freq: Four times a day (QID) | ORAL | 0 refills | Status: DC | PRN
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
## 2021-07-08 NOTE — Telephone Encounter (Signed)
Attempted to call for pre appointment review of allergies/meds. Message left. 

## 2021-07-08 NOTE — Progress Notes (Signed)
Nursing Pain Medication Assessment:  Safety precautions to be maintained throughout the outpatient stay will include: orient to surroundings, keep bed in low position, maintain call bell within reach at all times, provide assistance with transfer out of bed and ambulation.  Medication Inspection Compliance: Pill count conducted under aseptic conditions, in front of the patient. Neither the pills nor the bottle was removed from the patient's sight at any time. Once count was completed pills were immediately returned to the patient in their original bottle.  Medication: Hydrocodone/APAP Pill/Patch Count:  10 of 60 pills remain Pill/Patch Appearance: Markings consistent with prescribed medication Bottle Appearance: Old prescription bottle. Patient reminded that medications should always be kept in the newest prescription bottle. Filled Date: 07 / 08 / 2022 Last Medication intake:  Today  Patient reminded to bring current medication bottle.

## 2021-07-13 LAB — TOXASSURE SELECT 13 (MW), URINE

## 2021-08-24 ENCOUNTER — Encounter: Payer: Medicare HMO | Admitting: Student in an Organized Health Care Education/Training Program

## 2021-08-26 ENCOUNTER — Ambulatory Visit
Payer: Medicare HMO | Attending: Student in an Organized Health Care Education/Training Program | Admitting: Student in an Organized Health Care Education/Training Program

## 2021-08-26 ENCOUNTER — Other Ambulatory Visit: Payer: Self-pay

## 2021-08-26 ENCOUNTER — Encounter: Payer: Self-pay | Admitting: Student in an Organized Health Care Education/Training Program

## 2021-08-26 VITALS — BP 155/89 | HR 72 | Temp 97.2°F | Resp 20 | Ht 70.0 in | Wt 225.0 lb

## 2021-08-26 DIAGNOSIS — Z87828 Personal history of other (healed) physical injury and trauma: Secondary | ICD-10-CM | POA: Insufficient documentation

## 2021-08-26 DIAGNOSIS — M170 Bilateral primary osteoarthritis of knee: Secondary | ICD-10-CM | POA: Insufficient documentation

## 2021-08-26 DIAGNOSIS — M47816 Spondylosis without myelopathy or radiculopathy, lumbar region: Secondary | ICD-10-CM | POA: Insufficient documentation

## 2021-08-26 DIAGNOSIS — Z8781 Personal history of (healed) traumatic fracture: Secondary | ICD-10-CM | POA: Insufficient documentation

## 2021-08-26 DIAGNOSIS — F411 Generalized anxiety disorder: Secondary | ICD-10-CM | POA: Insufficient documentation

## 2021-08-26 DIAGNOSIS — Z720 Tobacco use: Secondary | ICD-10-CM | POA: Insufficient documentation

## 2021-08-26 DIAGNOSIS — G894 Chronic pain syndrome: Secondary | ICD-10-CM | POA: Insufficient documentation

## 2021-08-26 MED ORDER — CVS LUMBAR/BACK SUPPORT BRACE MISC: 0 refills | Status: AC

## 2021-08-26 MED ORDER — OXYCODONE-ACETAMINOPHEN 5-325 MG PO TABS: 1.0000 | ORAL_TABLET | Freq: Four times a day (QID) | ORAL | 0 refills | Status: AC | PRN

## 2021-08-26 MED ORDER — OXYCODONE-ACETAMINOPHEN 5-325 MG PO TABS: 1.0000 | ORAL_TABLET | Freq: Four times a day (QID) | ORAL | 0 refills | Status: DC | PRN
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
## 2021-08-26 NOTE — Progress Notes (Signed)
Nursing Pain Medication Assessment:  Safety precautions to be maintained throughout the outpatient stay will include: orient to surroundings, keep bed in low position, maintain call bell within reach at all times, provide assistance with transfer out of bed and ambulation.  Medication Inspection Compliance: Pill count conducted under aseptic conditions, in front of the patient. Neither the pills nor the bottle was removed from the patient's sight at any time. Once count was completed pills were immediately returned to the patient in their original bottle.  Medication: Oxycodone/APAP Pill/Patch Count:  54 of 120 pills remain Pill/Patch Appearance: Markings consistent with prescribed medication Bottle Appearance: Standard pharmacy container. Clearly labeled. Filled Date: 09 / 06 / 2022 Last Medication intake:  Today

## 2021-10-09 ENCOUNTER — Other Ambulatory Visit: Payer: Self-pay | Admitting: Student in an Organized Health Care Education/Training Program

## 2021-11-23 ENCOUNTER — Encounter: Payer: Self-pay | Admitting: Student in an Organized Health Care Education/Training Program

## 2021-11-23 ENCOUNTER — Ambulatory Visit
Payer: Medicare HMO | Attending: Student in an Organized Health Care Education/Training Program | Admitting: Student in an Organized Health Care Education/Training Program

## 2021-11-23 ENCOUNTER — Other Ambulatory Visit: Payer: Self-pay

## 2021-11-23 VITALS — BP 142/88 | HR 78 | Temp 97.1°F | Resp 18 | Ht 70.0 in | Wt 225.0 lb

## 2021-11-23 DIAGNOSIS — Z0289 Encounter for other administrative examinations: Secondary | ICD-10-CM | POA: Diagnosis not present

## 2021-11-23 DIAGNOSIS — Z87828 Personal history of other (healed) physical injury and trauma: Secondary | ICD-10-CM

## 2021-11-23 DIAGNOSIS — G894 Chronic pain syndrome: Secondary | ICD-10-CM

## 2021-11-23 DIAGNOSIS — M170 Bilateral primary osteoarthritis of knee: Secondary | ICD-10-CM | POA: Diagnosis present

## 2021-11-23 DIAGNOSIS — M47816 Spondylosis without myelopathy or radiculopathy, lumbar region: Secondary | ICD-10-CM | POA: Diagnosis not present

## 2021-11-23 DIAGNOSIS — F411 Generalized anxiety disorder: Secondary | ICD-10-CM | POA: Diagnosis present

## 2021-11-23 DIAGNOSIS — Z8781 Personal history of (healed) traumatic fracture: Secondary | ICD-10-CM

## 2021-11-23 MED ORDER — TIZANIDINE HCL 4 MG PO TABS: 4.0000 mg | ORAL_TABLET | Freq: Three times a day (TID) | ORAL | 2 refills | Status: DC | PRN

## 2021-11-23 MED ORDER — OXYCODONE-ACETAMINOPHEN 5-325 MG PO TABS: 1.0000 | ORAL_TABLET | Freq: Four times a day (QID) | ORAL | 0 refills | Status: DC | PRN

## 2021-11-23 MED ORDER — OXYCODONE-ACETAMINOPHEN 5-325 MG PO TABS: 1.0000 | ORAL_TABLET | Freq: Four times a day (QID) | ORAL | 0 refills | Status: AC | PRN

## 2021-11-23 MED ORDER — IBUPROFEN 800 MG PO TABS
800.0000 mg | ORAL_TABLET | Freq: Two times a day (BID) | ORAL | 2 refills | Status: DC | PRN
Start: 2021-11-23 — End: 2022-01-20

## 2021-11-23 MED ORDER — GABAPENTIN 300 MG PO CAPS
600.0000 mg | ORAL_CAPSULE | Freq: Four times a day (QID) | ORAL | 5 refills | Status: DC
Start: 2021-11-23 — End: 2022-04-26

## 2021-11-23 NOTE — Progress Notes (Signed)
Nursing Pain Medication Assessment:  Safety precautions to be maintained throughout the outpatient stay will include: orient to surroundings, keep bed in low position, maintain call bell within reach at all times, provide assistance with transfer out of bed and ambulation.  Medication Inspection Compliance: Pill count conducted under aseptic conditions, in front of the patient. Neither the pills nor the bottle was removed from the patient's sight at any time. Once count was completed pills were immediately returned to the patient in their original bottle.  Medication: Oxycodone/APAP Pill/Patch Count:  58 of 120 pills remain Pill/Patch Appearance: Markings consistent with prescribed medication Bottle Appearance: Standard pharmacy container. Clearly labeled. Filled Date: 49 / 05 / 2022 Last Medication intake:  Today
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
## 2022-01-16 ENCOUNTER — Other Ambulatory Visit: Payer: Self-pay | Admitting: Student in an Organized Health Care Education/Training Program

## 2022-01-20 ENCOUNTER — Other Ambulatory Visit: Payer: Self-pay | Admitting: Student in an Organized Health Care Education/Training Program

## 2022-01-20 ENCOUNTER — Telehealth: Payer: Self-pay | Admitting: Student in an Organized Health Care Education/Training Program

## 2022-01-20 MED ORDER — IBUPROFEN 800 MG PO TABS: 800.0000 mg | ORAL_TABLET | Freq: Two times a day (BID) | ORAL | 2 refills | Status: DC | PRN

## 2022-01-20 NOTE — Progress Notes (Signed)
Patient notified per voicemail. 

## 2022-01-20 NOTE — Telephone Encounter (Signed)
Needs prescription for #60 Ibuprofen. Message sent to Dr. Cherylann Ratel.

## 2022-02-23 ENCOUNTER — Other Ambulatory Visit: Payer: Self-pay | Admitting: Student in an Organized Health Care Education/Training Program

## 2022-03-01 ENCOUNTER — Encounter: Payer: Self-pay | Admitting: Student in an Organized Health Care Education/Training Program

## 2022-03-01 ENCOUNTER — Ambulatory Visit
Payer: Medicare HMO | Attending: Student in an Organized Health Care Education/Training Program | Admitting: Student in an Organized Health Care Education/Training Program

## 2022-03-01 ENCOUNTER — Other Ambulatory Visit: Payer: Self-pay

## 2022-03-01 VITALS — BP 144/84 | HR 93 | Temp 96.6°F | Resp 18 | Ht 70.0 in | Wt 225.0 lb

## 2022-03-01 DIAGNOSIS — G894 Chronic pain syndrome: Secondary | ICD-10-CM | POA: Diagnosis not present

## 2022-03-01 DIAGNOSIS — Z8781 Personal history of (healed) traumatic fracture: Secondary | ICD-10-CM | POA: Insufficient documentation

## 2022-03-01 DIAGNOSIS — M47816 Spondylosis without myelopathy or radiculopathy, lumbar region: Secondary | ICD-10-CM | POA: Insufficient documentation

## 2022-03-01 DIAGNOSIS — Z87828 Personal history of other (healed) physical injury and trauma: Secondary | ICD-10-CM | POA: Insufficient documentation

## 2022-03-01 DIAGNOSIS — M170 Bilateral primary osteoarthritis of knee: Secondary | ICD-10-CM | POA: Insufficient documentation

## 2022-03-01 MED ORDER — TIZANIDINE HCL 4 MG PO TABS
4.0000 mg | ORAL_TABLET | Freq: Three times a day (TID) | ORAL | 5 refills | Status: DC | PRN
Start: 2022-03-01 — End: 2022-07-26

## 2022-03-01 MED ORDER — OXYCODONE-ACETAMINOPHEN 5-325 MG PO TABS: 1.0000 | ORAL_TABLET | Freq: Four times a day (QID) | ORAL | 0 refills | Status: DC | PRN

## 2022-03-01 MED ORDER — OXYCODONE-ACETAMINOPHEN 5-325 MG PO TABS: 1.0000 | ORAL_TABLET | Freq: Four times a day (QID) | ORAL | 0 refills | Status: AC | PRN

## 2022-03-01 NOTE — Progress Notes (Signed)
Nursing Pain Medication Assessment:  ?Safety precautions to be maintained throughout the outpatient stay will include: orient to surroundings, keep bed in low position, maintain call bell within reach at all times, provide assistance with transfer out of bed and ambulation.  ?Medication Inspection Compliance: Pill count conducted under aseptic conditions, in front of the patient. Neither the pills nor the bottle was removed from the patient's sight at any time. Once count was completed pills were immediately returned to the patient in their original bottle. ? ?Medication: Oxycodone/APAP ?Pill/Patch Count:  84 of 120 pills remain ?Pill/Patch Appearance: Markings consistent with prescribed medication ?Bottle Appearance: Standard pharmacy container. Clearly labeled. ?Filled Date:  unable to read lable ?Last Medication intake:  Today ?

## 2022-03-01 NOTE — Progress Notes (Signed)
PROVIDER NOTE: Information contained herein reflects review and annotations entered in association with encounter. Interpretation of such information and data should be left to medically-trained personnel. Information provided to patient can be located elsewhere in the medical record under "Patient Instructions". Document created using STT-dictation technology, any transcriptional errors that may result from process are unintentional.  ?  ?[name]  Service Category: E/M  Provider: Gillis Santa, MD  ?DOB: 1961-07-04  DOS: 03/01/2022  Specialty: Interventional Pain Management  ?MRN: [id number]  Setting: Ambulatory outpatient  PCP: Tracie Harrier, MD  ?Type: Established Patient    Referring Provider: Tracie Harrier, MD  ?Location: Office  Delivery: Face-to-face    ? ?HPI  ?Mr. [name], a 61 y.o. year old male, is here today because of his No primary diagnosis found.. Mr. Sisneros primary complain today is Back Pain (Lower left and right, upper left) ? ?Last encounter: My last encounter with him was on 11/23/21. ? ?Pertinent problems: Mr. Marsolek has History of pelvic fracture; Chronic midline low back pain without sciatica; Tobacco user; Lumbar facet arthropathy; Chronic pain syndrome; Bilateral primary osteoarthritis of knee; and Pain management contract signed on their pertinent problem list. ?Pain Assessment: Severity of Chronic pain is reported as a 10-Worst pain ever/10. Location: Back Lower/both upper legs. Onset: More than a month ago. Quality: Sharp. Timing: Intermittent. Modifying factor(s): medications, stretching. ?Vitals:  height is 5' 10"  (1.778 m) and weight is 225 lb (102.1 kg). His temporal temperature is 96.6 ?F (35.9 ?C) (abnormal). His blood pressure is 144/84 (abnormal) and his pulse is 93. His respiration is 18 and oxygen saturation is 95%.  ? ?Reason for encounter: medication management.   ? ?No change in medical history since last visit.  Patient's pain is at baseline.   Patient continues multimodal pain regimen as prescribed.  States that it provides pain relief and improvement in functional status. ? ? ?Pharmacotherapy Assessment  ?Analgesic: Percocet 5 mg  every 6 hours PRN MME 30 #120  ? ?Monitoring: ?[place] PMP: PDMP reviewed during this encounter.       ?Pharmacotherapy: No side-effects or adverse reactions reported. ?Compliance: No problems identified. ?Effectiveness: Clinically acceptable.  UDS:  ?Summary  ?Date Value Ref Range Status  ?07/08/2021 Note  Final  ?  Comment:  ?  ==================================================================== ?[employer] Select 13 (MW) ?==================================================================== ?Test                             Result       Flag       Units ? ?Drug Present and Declared for Prescription Verification ?  Oxycodone                      2378         EXPECTED   ng/mg creat ?  Oxymorphone                    1813         EXPECTED   ng/mg creat ?  Noroxycodone                   2344         EXPECTED   ng/mg creat ?  Noroxymorphone                 297          EXPECTED   ng/mg creat ?   Sources of oxycodone are  scheduled prescription medications. ?   Oxymorphone, noroxycodone, and noroxymorphone are expected ?   metabolites of oxycodone. Oxymorphone is also available as a ?   scheduled prescription medication. ? ?==================================================================== ?Test                      Result    Flag   Units      Ref Range ?  Creatinine              156              mg/dL      >=20 ?==================================================================== ?Declared Medications: ? The flagging and interpretation on this report are based on the ? following declared medications.  Unexpected results may arise from ? inaccuracies in the declared medications. ? ? **Note: The testing scope of this panel includes these medications: ? ? Oxycodone (Percocet) ? ? **Note: The testing scope of this panel does not include  the ? following reported medications: ? ? Acetaminophen (Tylenol) ? Acetaminophen (Percocet) ? Budesonide (Symbicort) ? Chondroitin ? Formoterol (Symbicort) ? Gabapentin (Neurontin) ? Glucosamine ? Hydrocortisone ? Ibuprofen (Advil) ? Magnesium ? Multivitamin ? Nystatin (Mycostatin) ? Tizanidine (Zanaflex) ?==================================================================== ?For clinical consultation, please call [phone]. ?==================================================================== ?  ?  ? ? ?ROS  ?Constitutional: Denies any fever or chills ?Gastrointestinal: No reported hemesis, hematochezia, vomiting, or acute GI distress ?Musculoskeletal:  +LBP ?Neurological: No reported episodes of acute onset apraxia, aphasia, dysarthria, agnosia, amnesia, paralysis, loss of coordination, or loss of consciousness ? ?Medication Review  ?CVS Lumbar/Back Support Brace, Glucosamine-Chondroitin-MSM, Magnesium, acetaminophen, albuterol, budesonide-formoterol, gabapentin, hydrocortisone, ibuprofen, multivitamin, nystatin cream, oxyCODONE-acetaminophen, and tiZANidine ? ?History Review  ?Allergy: Mr. Brimage is allergic to amoxicillin. ?Drug: Mr. Tramell  reports no history of drug use. ?Alcohol:  has no history on file for alcohol use. ?Tobacco:  reports that he has been smoking cigarettes. He has been smoking an average of 2 packs per day. He uses smokeless tobacco. ?Social: Mr. Sine  reports that he has been smoking cigarettes. He has been smoking an average of 2 packs per day. He uses smokeless tobacco. He reports that he does not use drugs. ?Medical:  has a past medical history of Folliculitis, GAD (generalized anxiety disorder), History of fracture of pelvis, Hypertriglyceridemia, Medical history reviewed with no changes, and Sciatic nerve pain. ?Surgical: Mr. Delpizzo  has a past surgical history that includes Knee surgery (Bilateral) and Bony pelvis surgery (Left). ?Family: family history is not on  file. ? ?Laboratory Chemistry Profile  ? ?Renal ?No results found for: BUN, CREATININE, LABCREA, BCR, GFR, GFRAA, GFRNONAA, LABVMA, EPIRU, [id number], NOREPRU, [place], DOPARU, [id number] ?  Hepatic ?No results found for: AST, ALT, ALBUMIN, ALKPHOS, HCVAB, AMYLASE, LIPASE, AMMONIA ?  ?Electrolytes ?No results found for: NA, K, CL, CALCIUM, MG, PHOS ?  Bone ?Lab Results  ?Component Value Date  ? TESTOSTERONE 304 10/02/2019  ? ?  ?Inflammation (CRP: Acute Phase) (ESR: Chronic Phase) ?No results found for: CRP, ESRSEDRATE, LATICACIDVEN ?    ?Note: Above Lab results reviewed. ? ?Recent Imaging Review  ?MR BRAIN W WO CONTRAST ?CLINICAL DATA:  Low testosterone, elevated prolactin ? ?EXAM: ?MRI HEAD WITHOUT AND WITH CONTRAST ? ?TECHNIQUE: ?Multiplanar, multiecho pulse sequences of the brain and surrounding ?structures were obtained without and with intravenous contrast. ? ?CONTRAST:  95m GADAVIST GADOBUTROL 1 MMOL/ML IV SOLN ? ?COMPARISON:  None. ? 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 There is no sellar or ?suprasellar mass. Pituitary infundibulum is  midline. There is no ?acute infarction or intracranial hemorrhage. There is no ?intracranial mass, mass effect, or edema. There is no hydrocephalus ?or extra-axial fluid collection. Prominence of the ventricles and ?sulci reflects mild generalized parenchymal volume loss. Patchy T2 ?hyperintensity in the supratentorial white matter is nonspecific but ?may reflect mild chronic microvascular ischemic changes. No abnormal ?enhancement. ? ?Vascular: Major vessel flow voids at the skull base are preserved. ? ?Skull and upper cervical spine: Normal marrow signal is preserved. ? ?Sinuses/Orbits: Mild to moderate paranasal sinus mucosal thickening ?with some aerosolized secretions in the right maxillary sinus. ?Orbits are unremarkable. ? ?Other: Left mastoid tip fluid opacification. ? ?IMPRESSION: ?No sellar or suprasellar mass. ? ?Mild chronic microvascular  ischemic changes and cerebral volume ?loss. ? ?Electronically Signed ?  By: Macy Mis M.D. ?  On: 07/18/2020 13:59 ? ?Note: Reviewed       ? ?Physical Exam  ?General appearance: Well nourished, well developed, and well hydr

## 2022-04-26 ENCOUNTER — Encounter: Payer: Self-pay | Admitting: Student in an Organized Health Care Education/Training Program

## 2022-04-26 ENCOUNTER — Other Ambulatory Visit: Payer: Self-pay

## 2022-04-26 ENCOUNTER — Ambulatory Visit
Payer: Medicare HMO | Attending: Student in an Organized Health Care Education/Training Program | Admitting: Student in an Organized Health Care Education/Training Program

## 2022-04-26 VITALS — BP 127/70 | HR 69 | Temp 97.2°F | Resp 18 | Ht 70.0 in | Wt 225.0 lb

## 2022-04-26 DIAGNOSIS — M47816 Spondylosis without myelopathy or radiculopathy, lumbar region: Secondary | ICD-10-CM | POA: Diagnosis present

## 2022-04-26 DIAGNOSIS — Z87828 Personal history of other (healed) physical injury and trauma: Secondary | ICD-10-CM | POA: Insufficient documentation

## 2022-04-26 DIAGNOSIS — Z8781 Personal history of (healed) traumatic fracture: Secondary | ICD-10-CM | POA: Insufficient documentation

## 2022-04-26 DIAGNOSIS — M170 Bilateral primary osteoarthritis of knee: Secondary | ICD-10-CM | POA: Insufficient documentation

## 2022-04-26 DIAGNOSIS — G894 Chronic pain syndrome: Secondary | ICD-10-CM | POA: Diagnosis present

## 2022-04-26 MED ORDER — OXYCODONE-ACETAMINOPHEN 5-325 MG PO TABS: 1.0000 | ORAL_TABLET | Freq: Four times a day (QID) | ORAL | 0 refills | Status: DC | PRN

## 2022-04-26 MED ORDER — OXYCODONE-ACETAMINOPHEN 5-325 MG PO TABS: 1.0000 | ORAL_TABLET | Freq: Four times a day (QID) | ORAL | 0 refills | Status: AC | PRN

## 2022-04-26 MED ORDER — GABAPENTIN 300 MG PO CAPS: 600.0000 mg | ORAL_CAPSULE | Freq: Four times a day (QID) | ORAL | 5 refills | Status: DC

## 2022-04-26 MED ORDER — IBUPROFEN 800 MG PO TABS: 800.0000 mg | ORAL_TABLET | Freq: Two times a day (BID) | ORAL | 2 refills | Status: DC | PRN

## 2022-04-26 NOTE — Progress Notes (Signed)
Nursing Pain Medication Assessment:  Safety precautions to be maintained throughout the outpatient stay will include: orient to surroundings, keep bed in low position, maintain call bell within reach at all times, provide assistance with transfer out of bed and ambulation.  Medication Inspection Compliance: Pill count conducted under aseptic conditions, in front of the patient. Neither the pills nor the bottle was removed from the patient's sight at any time. Once count was completed pills were immediately returned to the patient in their original bottle.  Medication: Oxycodone IR Pill/Patch Count:  42 of 120 pills remain Pill/Patch Appearance: Markings consistent with prescribed medication Bottle Appearance: Standard pharmacy container. Clearly labeled. Filled Date: 05 / 04 / 2023 Last Medication intake:  Today
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
## 2022-06-28 ENCOUNTER — Other Ambulatory Visit: Payer: Self-pay | Admitting: Student in an Organized Health Care Education/Training Program

## 2022-07-16 ENCOUNTER — Other Ambulatory Visit: Payer: Self-pay | Admitting: Student in an Organized Health Care Education/Training Program

## 2022-07-26 ENCOUNTER — Ambulatory Visit
Payer: Medicare HMO | Attending: Student in an Organized Health Care Education/Training Program | Admitting: Student in an Organized Health Care Education/Training Program

## 2022-07-26 ENCOUNTER — Encounter: Payer: Self-pay | Admitting: Student in an Organized Health Care Education/Training Program

## 2022-07-26 ENCOUNTER — Other Ambulatory Visit: Payer: Self-pay

## 2022-07-26 VITALS — BP 129/87 | HR 91 | Temp 96.9°F | Resp 16 | Ht 70.0 in | Wt 225.0 lb

## 2022-07-26 DIAGNOSIS — M47816 Spondylosis without myelopathy or radiculopathy, lumbar region: Secondary | ICD-10-CM | POA: Diagnosis present

## 2022-07-26 DIAGNOSIS — Z87828 Personal history of other (healed) physical injury and trauma: Secondary | ICD-10-CM | POA: Diagnosis present

## 2022-07-26 DIAGNOSIS — F411 Generalized anxiety disorder: Secondary | ICD-10-CM | POA: Diagnosis present

## 2022-07-26 DIAGNOSIS — G894 Chronic pain syndrome: Secondary | ICD-10-CM | POA: Diagnosis present

## 2022-07-26 DIAGNOSIS — Z8781 Personal history of (healed) traumatic fracture: Secondary | ICD-10-CM | POA: Diagnosis present

## 2022-07-26 DIAGNOSIS — M170 Bilateral primary osteoarthritis of knee: Secondary | ICD-10-CM | POA: Insufficient documentation

## 2022-07-26 MED ORDER — OXYCODONE-ACETAMINOPHEN 5-325 MG PO TABS: 1.0000 | ORAL_TABLET | Freq: Four times a day (QID) | ORAL | 0 refills | Status: AC | PRN

## 2022-07-26 MED ORDER — GABAPENTIN 300 MG PO CAPS: 600.0000 mg | ORAL_CAPSULE | Freq: Four times a day (QID) | ORAL | 5 refills | Status: DC

## 2022-07-26 MED ORDER — TIZANIDINE HCL 4 MG PO TABS
4.0000 mg | ORAL_TABLET | Freq: Three times a day (TID) | ORAL | 5 refills | Status: DC | PRN
Start: 2022-07-26 — End: 2022-10-20

## 2022-07-26 MED ORDER — OXYCODONE-ACETAMINOPHEN 5-325 MG PO TABS: 1.0000 | ORAL_TABLET | Freq: Four times a day (QID) | ORAL | 0 refills | Status: DC | PRN
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
## 2022-07-26 NOTE — Progress Notes (Signed)
Nursing Pain Medication Assessment:  Safety precautions to be maintained throughout the outpatient stay will include: orient to surroundings, keep bed in low position, maintain call bell within reach at all times, provide assistance with transfer out of bed and ambulation.  Medication Inspection Compliance: Pill count conducted under aseptic conditions, in front of the patient. Neither the pills nor the bottle was removed from the patient's sight at any time. Once count was completed pills were immediately returned to the patient in their original bottle.  Medication: Oxycodone/APAP Pill/Patch Count:  37 of 120 pills remain Pill/Patch Appearance: Markings consistent with prescribed medication Bottle Appearance: Standard pharmacy container. Clearly labeled. Filled Date: 08 / 02 / 2023 Last Medication intake:  Today

## 2022-07-30 LAB — TOXASSURE SELECT 13 (MW), URINE

## 2022-09-06 ENCOUNTER — Telehealth: Payer: Self-pay | Admitting: Student in an Organized Health Care Education/Training Program

## 2022-09-06 NOTE — Telephone Encounter (Signed)
Patient is needing gabapentin script sent to pharmacy. His next med refill date is November.

## 2022-09-07 NOTE — Telephone Encounter (Signed)
Patient has refills on the script written 07-26-22

## 2022-09-08 ENCOUNTER — Telehealth: Payer: Self-pay | Admitting: Student in an Organized Health Care Education/Training Program

## 2022-09-08 DIAGNOSIS — Z6832 Body mass index (BMI) 32.0-32.9, adult: Secondary | ICD-10-CM | POA: Diagnosis not present

## 2022-09-08 DIAGNOSIS — Z87828 Personal history of other (healed) physical injury and trauma: Secondary | ICD-10-CM | POA: Diagnosis not present

## 2022-09-08 DIAGNOSIS — R0789 Other chest pain: Secondary | ICD-10-CM | POA: Diagnosis not present

## 2022-09-08 DIAGNOSIS — J209 Acute bronchitis, unspecified: Secondary | ICD-10-CM | POA: Diagnosis not present

## 2022-09-08 DIAGNOSIS — Z125 Encounter for screening for malignant neoplasm of prostate: Secondary | ICD-10-CM | POA: Diagnosis not present

## 2022-09-08 DIAGNOSIS — F411 Generalized anxiety disorder: Secondary | ICD-10-CM | POA: Diagnosis not present

## 2022-09-08 DIAGNOSIS — Z720 Tobacco use: Secondary | ICD-10-CM | POA: Diagnosis not present

## 2022-09-08 DIAGNOSIS — R7309 Other abnormal glucose: Secondary | ICD-10-CM | POA: Diagnosis not present

## 2022-09-08 DIAGNOSIS — N1831 Chronic kidney disease, stage 3a: Secondary | ICD-10-CM | POA: Diagnosis not present

## 2022-09-08 NOTE — Telephone Encounter (Signed)
PT stated that he switch insurance from [place] to [place], his member ID is [id number]. PT stated that he was able to get one of his prescription filled on 09-04-22. Other prescription couldn't be refilled due to the way the prescription is written. Please give patient a call. Thanks

## 2022-09-08 NOTE — Telephone Encounter (Signed)
Informed patient that I called pharmacy, he does not need new prescriptions for non opioids. Informed him that he must make in person med refill appt in order to receive 7 day supply prescription.

## 2022-09-08 NOTE — Telephone Encounter (Signed)
Spoke with pharmacist, patient does not need new scripts for meds in order to receive refills already written. They are unable to determine if the Oxy/Acet requires a 7-day supply before receiving a 30-day supply.

## 2022-09-16 DIAGNOSIS — D72829 Elevated white blood cell count, unspecified: Secondary | ICD-10-CM | POA: Diagnosis not present

## 2022-09-16 DIAGNOSIS — Z87828 Personal history of other (healed) physical injury and trauma: Secondary | ICD-10-CM | POA: Diagnosis not present

## 2022-09-16 DIAGNOSIS — N1831 Chronic kidney disease, stage 3a: Secondary | ICD-10-CM | POA: Diagnosis not present

## 2022-09-16 DIAGNOSIS — F330 Major depressive disorder, recurrent, mild: Secondary | ICD-10-CM | POA: Diagnosis not present

## 2022-09-16 DIAGNOSIS — G8929 Other chronic pain: Secondary | ICD-10-CM | POA: Diagnosis not present

## 2022-09-16 DIAGNOSIS — F17210 Nicotine dependence, cigarettes, uncomplicated: Secondary | ICD-10-CM | POA: Diagnosis not present

## 2022-09-16 DIAGNOSIS — J449 Chronic obstructive pulmonary disease, unspecified: Secondary | ICD-10-CM | POA: Diagnosis not present

## 2022-09-16 DIAGNOSIS — Z0000 Encounter for general adult medical examination without abnormal findings: Secondary | ICD-10-CM | POA: Diagnosis not present

## 2022-09-16 DIAGNOSIS — E663 Overweight: Secondary | ICD-10-CM | POA: Diagnosis not present

## 2022-09-16 DIAGNOSIS — Z6830 Body mass index (BMI) 30.0-30.9, adult: Secondary | ICD-10-CM | POA: Diagnosis not present

## 2022-09-16 DIAGNOSIS — R739 Hyperglycemia, unspecified: Secondary | ICD-10-CM | POA: Diagnosis not present

## 2022-09-16 DIAGNOSIS — M546 Pain in thoracic spine: Secondary | ICD-10-CM | POA: Diagnosis not present

## 2022-09-16 DIAGNOSIS — Z8781 Personal history of (healed) traumatic fracture: Secondary | ICD-10-CM | POA: Diagnosis not present

## 2022-10-10 ENCOUNTER — Telehealth: Payer: Self-pay | Admitting: Student in an Organized Health Care Education/Training Program

## 2022-10-10 DIAGNOSIS — G894 Chronic pain syndrome: Secondary | ICD-10-CM

## 2022-10-10 MED ORDER — GABAPENTIN 800 MG PO TABS
800.0000 mg | ORAL_TABLET | Freq: Three times a day (TID) | ORAL | 5 refills | Status: DC
Start: 2022-10-10 — End: 2023-01-12

## 2022-10-10 NOTE — Telephone Encounter (Signed)
Spoke with patient and he states that his new insurance will not cover gabapentin 300 mg sig 600 mg qid.  However they will cover gabapentin 800 mg and this does not exceed dose limitations.  I told him that I would ask BL if he would do this for him or if he will need an appt to discuss.

## 2022-10-10 NOTE — Telephone Encounter (Signed)
PT stated that when he spoke with doctor during his last visit he was told that if he need he would write prescription for Gabapentin 800 mg. PT is currently taking Gabapentin 300mg . Please give patient a call. Thanks

## 2022-10-13 DIAGNOSIS — J449 Chronic obstructive pulmonary disease, unspecified: Secondary | ICD-10-CM | POA: Diagnosis not present

## 2022-10-13 DIAGNOSIS — F17210 Nicotine dependence, cigarettes, uncomplicated: Secondary | ICD-10-CM | POA: Diagnosis not present

## 2022-10-13 DIAGNOSIS — R0609 Other forms of dyspnea: Secondary | ICD-10-CM | POA: Diagnosis not present

## 2022-10-14 ENCOUNTER — Other Ambulatory Visit: Payer: Self-pay | Admitting: Specialist

## 2022-10-14 DIAGNOSIS — F17210 Nicotine dependence, cigarettes, uncomplicated: Secondary | ICD-10-CM

## 2022-10-14 DIAGNOSIS — R0609 Other forms of dyspnea: Secondary | ICD-10-CM

## 2022-10-18 ENCOUNTER — Encounter: Payer: Medicare HMO | Admitting: Student in an Organized Health Care Education/Training Program

## 2022-10-20 ENCOUNTER — Ambulatory Visit
Payer: No Typology Code available for payment source | Attending: Student in an Organized Health Care Education/Training Program | Admitting: Student in an Organized Health Care Education/Training Program

## 2022-10-20 ENCOUNTER — Encounter: Payer: Self-pay | Admitting: Student in an Organized Health Care Education/Training Program

## 2022-10-20 VITALS — BP 132/88 | HR 97 | Temp 97.3°F | Ht 70.0 in | Wt 225.0 lb

## 2022-10-20 DIAGNOSIS — G894 Chronic pain syndrome: Secondary | ICD-10-CM | POA: Insufficient documentation

## 2022-10-20 DIAGNOSIS — F411 Generalized anxiety disorder: Secondary | ICD-10-CM | POA: Insufficient documentation

## 2022-10-20 DIAGNOSIS — Z8781 Personal history of (healed) traumatic fracture: Secondary | ICD-10-CM | POA: Insufficient documentation

## 2022-10-20 DIAGNOSIS — Z87828 Personal history of other (healed) physical injury and trauma: Secondary | ICD-10-CM | POA: Insufficient documentation

## 2022-10-20 DIAGNOSIS — M170 Bilateral primary osteoarthritis of knee: Secondary | ICD-10-CM | POA: Insufficient documentation

## 2022-10-20 DIAGNOSIS — M47816 Spondylosis without myelopathy or radiculopathy, lumbar region: Secondary | ICD-10-CM | POA: Insufficient documentation

## 2022-10-20 MED ORDER — OXYCODONE-ACETAMINOPHEN 5-325 MG PO TABS: 1.0000 | ORAL_TABLET | Freq: Four times a day (QID) | ORAL | 0 refills | Status: DC | PRN

## 2022-10-20 MED ORDER — OXYCODONE-ACETAMINOPHEN 5-325 MG PO TABS: 1.0000 | ORAL_TABLET | Freq: Four times a day (QID) | ORAL | 0 refills | Status: AC | PRN

## 2022-10-20 MED ORDER — TIZANIDINE HCL 4 MG PO TABS: 4.0000 mg | ORAL_TABLET | Freq: Three times a day (TID) | ORAL | 5 refills | Status: DC | PRN

## 2022-10-20 NOTE — Progress Notes (Signed)
Nursing Pain Medication Assessment:  Safety precautions to be maintained throughout the outpatient stay will include: orient to surroundings, keep bed in low position, maintain call bell within reach at all times, provide assistance with transfer out of bed and ambulation.  Medication Inspection Compliance: Pill count conducted under aseptic conditions, in front of the patient. Neither the pills nor the bottle was removed from the patient's sight at any time. Once count was completed pills were immediately returned to the patient in their original bottle.  Medication: Oxycodone/APAP Pill/Patch Count:  54 of 120 pills remain Pill/Patch Appearance: Markings consistent with prescribed medication Bottle Appearance: Standard pharmacy container. Clearly labeled. Filled Date: 10 / 31 / 2023 Last Medication intake:  TodaySafety precautions to be maintained throughout the outpatient stay will include: orient to surroundings, keep bed in low position, maintain call bell within reach at all times, provide assistance with transfer out of bed and ambulation.
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
## 2022-10-25 ENCOUNTER — Encounter
Payer: No Typology Code available for payment source | Admitting: Student in an Organized Health Care Education/Training Program

## 2022-12-14 ENCOUNTER — Other Ambulatory Visit: Payer: Self-pay | Admitting: *Deleted

## 2022-12-14 ENCOUNTER — Telehealth: Payer: Self-pay | Admitting: Student in an Organized Health Care Education/Training Program

## 2022-12-14 MED ORDER — IBUPROFEN 800 MG PO TABS: 800.0000 mg | ORAL_TABLET | Freq: Two times a day (BID) | ORAL | 2 refills | Status: DC | PRN

## 2022-12-14 NOTE — Telephone Encounter (Signed)
PT stated that he is still having problems getting his prescription for ibuprofen. PT stated this is the only prescription that he has a problem with getting refilled every month. Please give patient a call. Thanks

## 2022-12-14 NOTE — Telephone Encounter (Signed)
Called [facility], new Rx needed. Rx request sent to Dr. Holley Raring.

## 2023-01-12 ENCOUNTER — Encounter: Payer: Self-pay | Admitting: Student in an Organized Health Care Education/Training Program

## 2023-01-12 ENCOUNTER — Ambulatory Visit
Payer: No Typology Code available for payment source | Attending: Student in an Organized Health Care Education/Training Program | Admitting: Student in an Organized Health Care Education/Training Program

## 2023-01-12 VITALS — BP 150/84 | HR 77 | Temp 97.8°F | Ht 70.0 in | Wt 225.0 lb

## 2023-01-12 DIAGNOSIS — G894 Chronic pain syndrome: Secondary | ICD-10-CM | POA: Diagnosis not present

## 2023-01-12 DIAGNOSIS — M47816 Spondylosis without myelopathy or radiculopathy, lumbar region: Secondary | ICD-10-CM | POA: Diagnosis not present

## 2023-01-12 DIAGNOSIS — Z720 Tobacco use: Secondary | ICD-10-CM | POA: Insufficient documentation

## 2023-01-12 DIAGNOSIS — F411 Generalized anxiety disorder: Secondary | ICD-10-CM | POA: Diagnosis not present

## 2023-01-12 DIAGNOSIS — M170 Bilateral primary osteoarthritis of knee: Secondary | ICD-10-CM | POA: Insufficient documentation

## 2023-01-12 DIAGNOSIS — Z8781 Personal history of (healed) traumatic fracture: Secondary | ICD-10-CM | POA: Diagnosis not present

## 2023-01-12 DIAGNOSIS — Z87828 Personal history of other (healed) physical injury and trauma: Secondary | ICD-10-CM | POA: Insufficient documentation

## 2023-01-12 MED ORDER — OXYCODONE-ACETAMINOPHEN 5-325 MG PO TABS: 1.0000 | ORAL_TABLET | Freq: Four times a day (QID) | ORAL | 0 refills | Status: AC | PRN

## 2023-01-12 MED ORDER — IBUPROFEN 800 MG PO TABS: 800.0000 mg | ORAL_TABLET | Freq: Two times a day (BID) | ORAL | 5 refills | Status: DC | PRN

## 2023-01-12 MED ORDER — TIZANIDINE HCL 4 MG PO TABS: 4.0000 mg | ORAL_TABLET | Freq: Three times a day (TID) | ORAL | 5 refills | Status: DC | PRN

## 2023-01-12 MED ORDER — GABAPENTIN 800 MG PO TABS: 800.0000 mg | ORAL_TABLET | Freq: Three times a day (TID) | ORAL | 5 refills | Status: DC

## 2023-01-12 MED ORDER — OXYCODONE-ACETAMINOPHEN 5-325 MG PO TABS: 1.0000 | ORAL_TABLET | Freq: Four times a day (QID) | ORAL | 0 refills | Status: DC | PRN
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
## 2023-01-12 NOTE — Progress Notes (Signed)
Nursing Pain Medication Assessment:  Safety precautions to be maintained throughout the outpatient stay will include: orient to surroundings, keep bed in low position, maintain call bell within reach at all times, provide assistance with transfer out of bed and ambulation.  Medication Inspection Compliance: Pill count conducted under aseptic conditions, in front of the patient. Neither the pills nor the bottle was removed from the patient's sight at any time. Once count was completed pills were immediately returned to the patient in their original bottle.  Medication: Oxycodone/APAP Pill/Patch Count:  76 1/2 of 120 pills remain Pill/Patch Appearance: Markings consistent with prescribed medication Bottle Appearance: Standard pharmacy container. Clearly labeled. Filled Date: 1 / 29 / 2024 Last Medication intake:  TodaySafety precautions to be maintained throughout the outpatient stay will include: orient to surroundings, keep bed in low position, maintain call bell within reach at all times, provide assistance with transfer out of bed and ambulation.

## 2023-02-14 DIAGNOSIS — B356 Tinea cruris: Secondary | ICD-10-CM | POA: Diagnosis not present

## 2023-02-14 DIAGNOSIS — L03011 Cellulitis of right finger: Secondary | ICD-10-CM | POA: Diagnosis not present

## 2023-03-13 DIAGNOSIS — E785 Hyperlipidemia, unspecified: Secondary | ICD-10-CM | POA: Diagnosis not present

## 2023-03-13 DIAGNOSIS — Z87828 Personal history of other (healed) physical injury and trauma: Secondary | ICD-10-CM | POA: Diagnosis not present

## 2023-03-13 DIAGNOSIS — G8929 Other chronic pain: Secondary | ICD-10-CM | POA: Diagnosis not present

## 2023-03-13 DIAGNOSIS — Z720 Tobacco use: Secondary | ICD-10-CM | POA: Diagnosis not present

## 2023-03-13 DIAGNOSIS — Z0000 Encounter for general adult medical examination without abnormal findings: Secondary | ICD-10-CM | POA: Diagnosis not present

## 2023-03-13 DIAGNOSIS — R739 Hyperglycemia, unspecified: Secondary | ICD-10-CM | POA: Diagnosis not present

## 2023-03-13 DIAGNOSIS — Z8781 Personal history of (healed) traumatic fracture: Secondary | ICD-10-CM | POA: Diagnosis not present

## 2023-03-13 DIAGNOSIS — D72829 Elevated white blood cell count, unspecified: Secondary | ICD-10-CM | POA: Diagnosis not present

## 2023-03-20 DIAGNOSIS — F17210 Nicotine dependence, cigarettes, uncomplicated: Secondary | ICD-10-CM | POA: Diagnosis not present

## 2023-03-20 DIAGNOSIS — N1831 Chronic kidney disease, stage 3a: Secondary | ICD-10-CM | POA: Diagnosis not present

## 2023-03-20 DIAGNOSIS — Z6830 Body mass index (BMI) 30.0-30.9, adult: Secondary | ICD-10-CM | POA: Diagnosis not present

## 2023-03-20 DIAGNOSIS — M545 Low back pain, unspecified: Secondary | ICD-10-CM | POA: Diagnosis not present

## 2023-03-20 DIAGNOSIS — F330 Major depressive disorder, recurrent, mild: Secondary | ICD-10-CM | POA: Diagnosis not present

## 2023-03-20 DIAGNOSIS — E221 Hyperprolactinemia: Secondary | ICD-10-CM | POA: Diagnosis not present

## 2023-03-20 DIAGNOSIS — Z113 Encounter for screening for infections with a predominantly sexual mode of transmission: Secondary | ICD-10-CM | POA: Diagnosis not present

## 2023-03-20 DIAGNOSIS — Z0000 Encounter for general adult medical examination without abnormal findings: Secondary | ICD-10-CM | POA: Diagnosis not present

## 2023-03-20 DIAGNOSIS — Z23 Encounter for immunization: Secondary | ICD-10-CM | POA: Diagnosis not present

## 2023-03-20 DIAGNOSIS — L309 Dermatitis, unspecified: Secondary | ICD-10-CM | POA: Diagnosis not present

## 2023-03-20 DIAGNOSIS — G894 Chronic pain syndrome: Secondary | ICD-10-CM | POA: Diagnosis not present

## 2023-03-20 DIAGNOSIS — E1165 Type 2 diabetes mellitus with hyperglycemia: Secondary | ICD-10-CM | POA: Diagnosis not present

## 2023-03-20 DIAGNOSIS — L409 Psoriasis, unspecified: Secondary | ICD-10-CM | POA: Diagnosis not present

## 2023-03-20 DIAGNOSIS — Z114 Encounter for screening for human immunodeficiency virus [HIV]: Secondary | ICD-10-CM | POA: Diagnosis not present

## 2023-03-20 DIAGNOSIS — Z1159 Encounter for screening for other viral diseases: Secondary | ICD-10-CM | POA: Diagnosis not present

## 2023-03-20 DIAGNOSIS — Z711 Person with feared health complaint in whom no diagnosis is made: Secondary | ICD-10-CM | POA: Diagnosis not present

## 2023-04-04 ENCOUNTER — Encounter: Payer: Self-pay | Admitting: Student in an Organized Health Care Education/Training Program

## 2023-04-04 ENCOUNTER — Ambulatory Visit
Payer: No Typology Code available for payment source | Attending: Student in an Organized Health Care Education/Training Program | Admitting: Student in an Organized Health Care Education/Training Program

## 2023-04-04 VITALS — BP 112/80 | HR 72 | Temp 97.4°F | Resp 16 | Ht 70.0 in | Wt 220.0 lb

## 2023-04-04 DIAGNOSIS — M47816 Spondylosis without myelopathy or radiculopathy, lumbar region: Secondary | ICD-10-CM | POA: Insufficient documentation

## 2023-04-04 DIAGNOSIS — M170 Bilateral primary osteoarthritis of knee: Secondary | ICD-10-CM | POA: Diagnosis not present

## 2023-04-04 DIAGNOSIS — Z8781 Personal history of (healed) traumatic fracture: Secondary | ICD-10-CM | POA: Diagnosis not present

## 2023-04-04 DIAGNOSIS — G894 Chronic pain syndrome: Secondary | ICD-10-CM | POA: Diagnosis not present

## 2023-04-04 MED ORDER — OXYCODONE-ACETAMINOPHEN 5-325 MG PO TABS
1.0000 | ORAL_TABLET | Freq: Four times a day (QID) | ORAL | 0 refills | Status: AC | PRN
Start: 2023-05-02 — End: 2023-06-01

## 2023-04-04 MED ORDER — OXYCODONE-ACETAMINOPHEN 5-325 MG PO TABS
1.0000 | ORAL_TABLET | Freq: Four times a day (QID) | ORAL | 0 refills | Status: AC | PRN
Start: 2023-06-01 — End: 2023-07-01

## 2023-04-04 MED ORDER — OXYCODONE-ACETAMINOPHEN 5-325 MG PO TABS
1.0000 | ORAL_TABLET | Freq: Four times a day (QID) | ORAL | 0 refills | Status: DC | PRN
Start: 2023-07-01 — End: 2023-07-25

## 2023-04-04 NOTE — Progress Notes (Signed)
PROVIDER NOTE: Information contained herein reflects review and annotations entered in association with encounter. Interpretation of such information and data should be left to medically-trained personnel. Information provided to patient can be located elsewhere in the medical record under "Patient Instructions". Document created using STT-dictation technology, any transcriptional errors that may result from process are unintentional.    [name]  Service Category: E/M  Provider: Edward Jolly, MD  DOB: 1961-06-28  DOS: 04/04/2023  Specialty: Interventional Pain Management  MRN: [id number]  Setting: Ambulatory outpatient  PCP: Barbette Reichmann, MD  Type: Established Patient    Referring Provider: Barbette Reichmann, MD  Location: Office  Delivery: Face-to-face     HPI  [name], a 62 y.o. year old male, is here today because of his Bilateral primary osteoarthritis of knee [M17.0]. Mr. Capers primary complain today is Back Pain (Lumbar midline and upper midline ), Neck Pain (Bilateral ), Elbow Pain (Bilateral ), Wrist Pain (Bilateral ), Ankle Pain (Left ), Pelvic Pain (Mainly on the left ), and Knee Pain (Bilateral )  Last encounter: My last encounter with him was on 01/12/23  Pertinent problems: [name] has History of pelvic fracture; Chronic midline low back pain without sciatica; Tobacco user; Lumbar facet arthropathy; Chronic pain syndrome; Bilateral primary osteoarthritis of knee; and Pain management contract signed on their pertinent problem list. Pain Assessment: Severity of Chronic pain is reported as a 4 /10. Location: Back (please see visit info for all pain sites.) Lower, Left, Right/only on extreme rare occassions.. Onset: More than a month ago. Quality: Discomfort, Constant, Cramping, Dull, Pressure, Sharp. Timing: Constant. Modifying factor(s): medications, sometimes PT helps. Vitals:  height is 5\' 10"  (1.778 m) and weight is 220 lb (99.8 kg). His temporal temperature  is 97.4 F (36.3 C) (abnormal). His blood pressure is 112/80 and his pulse is 72. His respiration is 16 and oxygen saturation is 97%.   Reason for encounter: medication management.    No change in medical history since last visit.  Patient's pain is at baseline.  Patient continues multimodal pain regimen as prescribed.  States that it provides pain relief and improvement in functional status. Started Januvia for diabetes management  Pharmacotherapy Assessment  Analgesic: Percocet 5 mg  every 6 hours PRN MME 30 #120   Monitoring: [place] PMP: PDMP reviewed during this encounter.       Pharmacotherapy: No side-effects or adverse reactions reported. Compliance: No problems identified. Effectiveness: Clinically acceptable.  UDS:  Summary  Date Value Ref Range Status  07/26/2022 Note  Final    Comment:    ==================================================================== [employer] [facility] (MW) ==================================================================== Test                             Result       Flag       Units  Drug Present and Declared for Prescription Verification   Oxycodone                      988          EXPECTED   ng/mg creat   Oxymorphone                    2331         EXPECTED   ng/mg creat   Noroxycodone                   1005  EXPECTED   ng/mg creat   Noroxymorphone                 206          EXPECTED   ng/mg creat    Sources of oxycodone are scheduled prescription medications.    Oxymorphone, noroxycodone, and noroxymorphone are expected    metabolites of oxycodone. Oxymorphone is also available as a    scheduled prescription medication.  ==================================================================== Test                      Result    Flag   Units      Ref Range   Creatinine              166              mg/dL      >=40 ==================================================================== Declared Medications:  The flagging and  interpretation on this report are based on the  following declared medications.  Unexpected results may arise from  inaccuracies in the declared medications.   **Note: The testing scope of this panel includes these medications:   Oxycodone (Percocet)   **Note: The testing scope of this panel does not include the  following reported medications:   Acetaminophen (Tylenol)  Acetaminophen (Percocet)  Albuterol  Budesonide (Symbicort)  Clotrimazole (Lotrimin)  Formoterol (Symbicort)  Gabapentin (Neurontin)  Glucosamine  Hydrocortisone  Ibuprofen (Advil)  Magnesium  Methylsulfonylmethane  Multivitamin  Nystatin  Tizanidine ==================================================================== For clinical consultation, please call [phone]. ====================================================================       ROS  Constitutional: Denies any fever or chills Gastrointestinal: No reported hemesis, hematochezia, vomiting, or acute GI distress Musculoskeletal:  +LBP Neurological: No reported episodes of acute onset apraxia, aphasia, dysarthria, agnosia, amnesia, paralysis, loss of coordination, or loss of consciousness  Medication Review  [employer] Lumbar/Back Support Brace, Glucosamine-Chondroitin-MSM, Magnesium, acetaminophen, albuterol, budesonide-formoterol, clotrimazole, gabapentin, ibuprofen, multivitamin, nystatin cream, oxyCODONE-acetaminophen, sitaGLIPtin, and tiZANidine  History Review  Allergy: [name] is allergic to amoxicillin. Drug: [name]  reports no history of drug use. Alcohol:  has no history on file for alcohol use. Tobacco:  reports that he has been smoking cigarettes. He has been smoking an average of 2 packs per day. He uses smokeless tobacco. Social: [name]  reports that he has been smoking cigarettes. He has been smoking an average of 2 packs per day. He uses smokeless tobacco. He reports that he does not use drugs. Medical:  has a past medical  history of Folliculitis, GAD (generalized anxiety disorder), History of fracture of pelvis, Hypertriglyceridemia, Medical history reviewed with no changes, and Sciatic nerve pain. Surgical: [name]  has a past surgical history that includes Knee surgery (Bilateral) and Bony pelvis surgery (Left). Family: family history is not on file.  Laboratory Chemistry Profile   Renal No results found for: "BUN", "CREATININE", "LABCREA", "BCR", "GFR", "GFRAA", "GFRNONAA", "LABVMA", "EPIRU", "EPINEPH24HUR", "NOREPRU", "NOREPI24HUR", "DOPARU", "DOPAM24HRUR"   Hepatic No results found for: "AST", "ALT", "ALBUMIN", "ALKPHOS", "HCVAB", "AMYLASE", "LIPASE", "AMMONIA"   Electrolytes No results found for: "NA", "K", "CL", "CALCIUM", "MG", "PHOS"   Bone Lab Results  Component Value Date   TESTOSTERONE 304 10/02/2019     Inflammation (CRP: Acute Phase) (ESR: Chronic Phase) No results found for: "CRP", "ESRSEDRATE", "LATICACIDVEN"     Note: Above Lab results reviewed.  Recent Imaging Review  MR BRAIN W WO CONTRAST CLINICAL DATA:  Low testosterone, elevated prolactin  EXAM: MRI HEAD WITHOUT AND WITH CONTRAST  TECHNIQUE:  Multiplanar, multiecho pulse sequences of the brain and surrounding structures were obtained without and with intravenous contrast.  CONTRAST:  10mL GADAVIST GADOBUTROL 1 MMOL/ML IV SOLN  COMPARISON:  None.  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 There is no sellar or suprasellar mass. Pituitary infundibulum is midline. There is no acute infarction or intracranial hemorrhage. There is no intracranial mass, mass effect, or edema. There is no hydrocephalus or extra-axial fluid collection. Prominence of the ventricles and sulci reflects mild generalized parenchymal volume loss. Patchy T2 hyperintensity in the supratentorial white matter is nonspecific but may reflect mild chronic microvascular ischemic changes. No abnormal enhancement.  Vascular: Major vessel flow  voids at the skull base are preserved.  Skull and upper cervical spine: Normal marrow signal is preserved.  Sinuses/Orbits: Mild to moderate paranasal sinus mucosal thickening with some aerosolized secretions in the right maxillary sinus. Orbits are unremarkable.  Other: Left mastoid tip fluid opacification.  IMPRESSION: No sellar or suprasellar mass.  Mild chronic microvascular ischemic changes and cerebral volume loss.  Electronically Signed   By: Guadlupe Spanish M.D.   On: 07/18/2020 13:59  Note: Reviewed        Physical Exam  General appearance: Well nourished, well developed, and well hydrated. In no apparent acute distress Mental status: Alert, oriented x 3 (person, place, & time)       Respiratory: No evidence of acute respiratory distress Eyes: PERLA Vitals: BP 112/80 (BP Location: Right Arm, Patient Position: Sitting, Cuff Size: Large)   Pulse 72   Temp (!) 97.4 F (36.3 C) (Temporal)   Resp 16   Ht 5\' 10"  (1.778 m)   Wt 220 lb (99.8 kg)   SpO2 97%   BMI 31.57 kg/m  BMI: Estimated body mass index is 31.57 kg/m as calculated from the following:   Height as of this encounter: 5\' 10"  (1.778 m).   Weight as of this encounter: 220 lb (99.8 kg). Ideal: Ideal body weight: 73 kg (160 lb 15 oz) Adjusted ideal body weight: 83.7 kg (184 lb 9 oz)   Lumbar Exam  Skin & Axial Inspection: lumbar support brace in place Alignment: Symmetrical Functional ROM: Pain restricted ROM       Stability: No instability detected Muscle Tone/Strength: Functionally intact. No obvious neuro-muscular anomalies detected. Sensory (Neurological): Musculoskeletal pain pattern Palpation: No palpable anomalies       Provocative Tests: Hyperextension/rotation test: (+) bilaterally for facet joint pain. Lumbar quadrant test (Kemp's test): (+) bilaterally for facet joint pain.  Gait & Posture Assessment  Ambulation: Unassisted Gait: Relatively normal for age and body habitus Posture: WNL     Lower Extremity Exam      Side: Right lower extremity   Side: Left lower extremity  Stability: No instability observed           Stability: No instability observed          Skin & Extremity Inspection: Evidence of prior arthroplastic surgery   Skin & Extremity Inspection: Evidence of prior arthroplastic surgery  Functional ROM: Pain restricted ROM for hip and knee joints           Functional ROM: Pain restricted ROM for hip and knee joints          Muscle Tone/Strength: Functionally intact. No obvious neuro-muscular anomalies detected.   Muscle Tone/Strength: Functionally intact. No obvious neuro-muscular anomalies detected.  Sensory (Neurological): Musculoskeletal pain pattern         Sensory (Neurological): Musculoskeletal pain pattern  DTR: Patellar: deferred today Achilles: deferred today Plantar: deferred today   DTR: Patellar: deferred today Achilles: deferred today Plantar: deferred today  Palpation: No palpable anomalies   Palpation: No palpable anomalies   Assessment   Status Diagnosis  Controlled Controlled Controlled 1. Bilateral primary osteoarthritis of knee   2. Chronic pain syndrome   3. History of pelvic fracture   4. Lumbar facet arthropathy           Plan of Care  Mr. Becky Berberian has a current medication list which includes the following long-term medication(s): albuterol, gabapentin, januvia, and budesonide-formoterol.  Pharmacotherapy (Medications Ordered): Meds ordered this encounter  Medications   oxyCODONE-acetaminophen (PERCOCET) 5-325 MG tablet    Sig: Take 1 tablet by mouth every 6 (six) hours as needed for severe pain. Must last 30 days.    Dispense:  120 tablet    Refill:  0    Chronic Pain. (STOP Act - Not applicable). Fill one day early if closed on scheduled refill date.   oxyCODONE-acetaminophen (PERCOCET) 5-325 MG tablet    Sig: Take 1 tablet by mouth every 6 (six) hours as needed for severe pain. Must last 30 days.     Dispense:  120 tablet    Refill:  0    Chronic Pain. (STOP Act - Not applicable). Fill one day early if closed on scheduled refill date.   oxyCODONE-acetaminophen (PERCOCET) 5-325 MG tablet    Sig: Take 1 tablet by mouth every 6 (six) hours as needed for severe pain. Must last 30 days.    Dispense:  120 tablet    Refill:  0    Chronic Pain. (STOP Act - Not applicable). Fill one day early if closed on scheduled refill date.    Continue Gabapentin & Tizanidine as prescribed no refills needed  Patient cautioned on renal health with chronic NSAID use.  Follow-up plan:   Return in about 4 months (around 07/27/2023) for Medication Management, in person.      Interventional management options:  Considering:   Diagnostic lumbar facet medial branch nerve blocks   PRN Procedures:   None at this time      Recent Visits Date Type Provider Dept  01/12/23 Office Visit Edward Jolly, MD [facility]  Showing recent visits within past 90 days and meeting all other requirements Today's Visits Date Type Provider Dept  04/04/23 Office Visit Edward Jolly, MD [facility]  Showing today's visits and meeting all other requirements Future Appointments No visits were found meeting these conditions. Showing future appointments within next 90 days and meeting all other requirements  I discussed the assessment and treatment plan with the patient. The patient was provided an opportunity to ask questions and all were answered. The patient agreed with the plan and demonstrated an understanding of the instructions.  Patient advised to call back or seek an in-person evaluation if the symptoms or condition worsens.  Duration of encounter: .  Note by: Edward Jolly, MD Date: 04/04/2023; Time: 1:30 PM

## 2023-04-04 NOTE — Progress Notes (Signed)
Nursing Pain Medication Assessment:  Safety precautions to be maintained throughout the outpatient stay will include: orient to surroundings, keep bed in low position, maintain call bell within reach at all times, provide assistance with transfer out of bed and ambulation.  Medication Inspection Compliance: Pill count conducted under aseptic conditions, in front of the patient. Neither the pills nor the bottle was removed from the patient's sight at any time. Once count was completed pills were immediately returned to the patient in their original bottle.  Medication: Oxycodone/APAP Pill/Patch Count:  110 of 120 pills remain Pill/Patch Appearance: Markings consistent with prescribed medication Bottle Appearance: Standard pharmacy container. Clearly labeled. Filled Date: 04 / 28 / 2024 Last Medication intake:  Today

## 2023-05-03 DIAGNOSIS — H5203 Hypermetropia, bilateral: Secondary | ICD-10-CM | POA: Diagnosis not present

## 2023-05-03 DIAGNOSIS — H52223 Regular astigmatism, bilateral: Secondary | ICD-10-CM | POA: Diagnosis not present

## 2023-05-03 DIAGNOSIS — H2513 Age-related nuclear cataract, bilateral: Secondary | ICD-10-CM | POA: Diagnosis not present

## 2023-05-03 DIAGNOSIS — E119 Type 2 diabetes mellitus without complications: Secondary | ICD-10-CM | POA: Diagnosis not present

## 2023-05-03 DIAGNOSIS — H04123 Dry eye syndrome of bilateral lacrimal glands: Secondary | ICD-10-CM | POA: Diagnosis not present

## 2023-05-04 DIAGNOSIS — Z0100 Encounter for examination of eyes and vision without abnormal findings: Secondary | ICD-10-CM | POA: Diagnosis not present

## 2023-06-14 DIAGNOSIS — H2513 Age-related nuclear cataract, bilateral: Secondary | ICD-10-CM | POA: Diagnosis not present

## 2023-06-14 DIAGNOSIS — H04123 Dry eye syndrome of bilateral lacrimal glands: Secondary | ICD-10-CM | POA: Diagnosis not present

## 2023-07-14 DIAGNOSIS — Z125 Encounter for screening for malignant neoplasm of prostate: Secondary | ICD-10-CM | POA: Diagnosis not present

## 2023-07-14 DIAGNOSIS — G894 Chronic pain syndrome: Secondary | ICD-10-CM | POA: Diagnosis not present

## 2023-07-14 DIAGNOSIS — Z711 Person with feared health complaint in whom no diagnosis is made: Secondary | ICD-10-CM | POA: Diagnosis not present

## 2023-07-14 DIAGNOSIS — L309 Dermatitis, unspecified: Secondary | ICD-10-CM | POA: Diagnosis not present

## 2023-07-14 DIAGNOSIS — E1165 Type 2 diabetes mellitus with hyperglycemia: Secondary | ICD-10-CM | POA: Diagnosis not present

## 2023-07-14 DIAGNOSIS — G8929 Other chronic pain: Secondary | ICD-10-CM | POA: Diagnosis not present

## 2023-07-14 DIAGNOSIS — M545 Low back pain, unspecified: Secondary | ICD-10-CM | POA: Diagnosis not present

## 2023-07-14 DIAGNOSIS — Z720 Tobacco use: Secondary | ICD-10-CM | POA: Diagnosis not present

## 2023-07-20 DIAGNOSIS — Z87828 Personal history of other (healed) physical injury and trauma: Secondary | ICD-10-CM | POA: Diagnosis not present

## 2023-07-20 DIAGNOSIS — E781 Pure hyperglyceridemia: Secondary | ICD-10-CM | POA: Diagnosis not present

## 2023-07-20 DIAGNOSIS — F411 Generalized anxiety disorder: Secondary | ICD-10-CM | POA: Diagnosis not present

## 2023-07-20 DIAGNOSIS — Z1211 Encounter for screening for malignant neoplasm of colon: Secondary | ICD-10-CM | POA: Diagnosis not present

## 2023-07-20 DIAGNOSIS — M7989 Other specified soft tissue disorders: Secondary | ICD-10-CM | POA: Diagnosis not present

## 2023-07-20 DIAGNOSIS — D72829 Elevated white blood cell count, unspecified: Secondary | ICD-10-CM | POA: Diagnosis not present

## 2023-07-20 DIAGNOSIS — G894 Chronic pain syndrome: Secondary | ICD-10-CM | POA: Diagnosis not present

## 2023-07-20 DIAGNOSIS — Z720 Tobacco use: Secondary | ICD-10-CM | POA: Diagnosis not present

## 2023-07-25 ENCOUNTER — Encounter: Payer: Self-pay | Admitting: Student in an Organized Health Care Education/Training Program

## 2023-07-25 ENCOUNTER — Ambulatory Visit
Payer: No Typology Code available for payment source | Attending: Student in an Organized Health Care Education/Training Program | Admitting: Student in an Organized Health Care Education/Training Program

## 2023-07-25 VITALS — BP 132/87 | HR 77 | Temp 96.8°F | Resp 16 | Ht 70.0 in | Wt 218.0 lb

## 2023-07-25 DIAGNOSIS — G894 Chronic pain syndrome: Secondary | ICD-10-CM | POA: Insufficient documentation

## 2023-07-25 DIAGNOSIS — Z8781 Personal history of (healed) traumatic fracture: Secondary | ICD-10-CM | POA: Insufficient documentation

## 2023-07-25 DIAGNOSIS — M47816 Spondylosis without myelopathy or radiculopathy, lumbar region: Secondary | ICD-10-CM | POA: Diagnosis not present

## 2023-07-25 DIAGNOSIS — M170 Bilateral primary osteoarthritis of knee: Secondary | ICD-10-CM | POA: Insufficient documentation

## 2023-07-25 DIAGNOSIS — Z87828 Personal history of other (healed) physical injury and trauma: Secondary | ICD-10-CM | POA: Insufficient documentation

## 2023-07-25 MED ORDER — OXYCODONE-ACETAMINOPHEN 5-325 MG PO TABS: 1.0000 | ORAL_TABLET | Freq: Four times a day (QID) | ORAL | 0 refills | Status: AC | PRN

## 2023-07-25 MED ORDER — OXYCODONE-ACETAMINOPHEN 5-325 MG PO TABS
1.0000 | ORAL_TABLET | Freq: Four times a day (QID) | ORAL | 0 refills | Status: AC | PRN
Start: 2023-07-31 — End: 2023-08-30

## 2023-07-25 MED ORDER — OXYCODONE-ACETAMINOPHEN 5-325 MG PO TABS
1.0000 | ORAL_TABLET | Freq: Four times a day (QID) | ORAL | 0 refills | Status: DC | PRN
Start: 2023-09-29 — End: 2023-10-24

## 2023-07-25 MED ORDER — GABAPENTIN 800 MG PO TABS: 800.0000 mg | ORAL_TABLET | Freq: Two times a day (BID) | ORAL | 5 refills | Status: DC

## 2023-07-25 NOTE — Progress Notes (Signed)
Nursing Pain Medication Assessment:  Safety precautions to be maintained throughout the outpatient stay will include: orient to surroundings, keep bed in low position, maintain call bell within reach at all times, provide assistance with transfer out of bed and ambulation.  Medication Inspection Compliance: Pill count conducted under aseptic conditions, in front of the patient. Neither the pills nor the bottle was removed from the patient's sight at any time. Once count was completed pills were immediately returned to the patient in their original bottle.  Medication: Oxycodone/APAP Pill/Patch Count:  19 of 120 pills remain Pill/Patch Appearance: Markings consistent with prescribed medication Bottle Appearance: Standard pharmacy container. Clearly labeled. Filled Date: 07 / 27 / 2024 Last Medication intake:  Today
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
## 2023-07-26 ENCOUNTER — Other Ambulatory Visit: Payer: Self-pay | Admitting: Student in an Organized Health Care Education/Training Program

## 2023-07-28 LAB — TOXASSURE SELECT 13 (MW), URINE

## 2023-10-24 ENCOUNTER — Encounter: Payer: Self-pay | Admitting: Student in an Organized Health Care Education/Training Program

## 2023-10-24 ENCOUNTER — Ambulatory Visit
Payer: No Typology Code available for payment source | Attending: Student in an Organized Health Care Education/Training Program | Admitting: Student in an Organized Health Care Education/Training Program

## 2023-10-24 VITALS — BP 153/93 | HR 72 | Temp 98.2°F | Resp 17 | Ht 70.0 in | Wt 220.0 lb

## 2023-10-24 DIAGNOSIS — Z8781 Personal history of (healed) traumatic fracture: Secondary | ICD-10-CM | POA: Insufficient documentation

## 2023-10-24 DIAGNOSIS — M47816 Spondylosis without myelopathy or radiculopathy, lumbar region: Secondary | ICD-10-CM | POA: Insufficient documentation

## 2023-10-24 DIAGNOSIS — M170 Bilateral primary osteoarthritis of knee: Secondary | ICD-10-CM | POA: Insufficient documentation

## 2023-10-24 DIAGNOSIS — Z87828 Personal history of other (healed) physical injury and trauma: Secondary | ICD-10-CM | POA: Diagnosis not present

## 2023-10-24 DIAGNOSIS — G894 Chronic pain syndrome: Secondary | ICD-10-CM | POA: Diagnosis not present

## 2023-10-24 MED ORDER — OXYCODONE-ACETAMINOPHEN 5-325 MG PO TABS: 1.0000 | ORAL_TABLET | Freq: Four times a day (QID) | ORAL | 0 refills | Status: AC | PRN

## 2023-10-24 MED ORDER — IBUPROFEN 800 MG PO TABS: 800.0000 mg | ORAL_TABLET | Freq: Two times a day (BID) | ORAL | 5 refills | Status: DC | PRN

## 2023-10-24 MED ORDER — GABAPENTIN 800 MG PO TABS: 800.0000 mg | ORAL_TABLET | Freq: Two times a day (BID) | ORAL | 5 refills | Status: DC

## 2023-10-24 MED ORDER — TIZANIDINE HCL 4 MG PO TABS: 4.0000 mg | ORAL_TABLET | Freq: Three times a day (TID) | ORAL | 5 refills | Status: DC | PRN

## 2023-10-24 MED ORDER — OXYCODONE-ACETAMINOPHEN 5-325 MG PO TABS: 1.0000 | ORAL_TABLET | Freq: Four times a day (QID) | ORAL | 0 refills | Status: DC | PRN

## 2023-10-24 NOTE — Progress Notes (Signed)
Nursing Pain Medication Assessment:  Safety precautions to be maintained throughout the outpatient stay will include: orient to surroundings, keep bed in low position, maintain call bell within reach at all times, provide assistance with transfer out of bed and ambulation.  Medication Inspection Compliance: Pill count conducted under aseptic conditions, in front of the patient. Neither the pills nor the bottle was removed from the patient's sight at any time. Once count was completed pills were immediately returned to the patient in their original bottle.  Medication: Oxycodone/APAP Pill/Patch Count:  16 of 120 pills remain Pill/Patch Appearance: Markings consistent with prescribed medication Bottle Appearance: Standard pharmacy container. Clearly labeled. Filled Date: 76 / 25 / 2024 Last Medication intake:  Today
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
## 2024-01-04 DIAGNOSIS — F411 Generalized anxiety disorder: Secondary | ICD-10-CM | POA: Diagnosis not present

## 2024-01-04 DIAGNOSIS — S41001A Unspecified open wound of right shoulder, initial encounter: Secondary | ICD-10-CM | POA: Diagnosis not present

## 2024-01-04 DIAGNOSIS — N1831 Chronic kidney disease, stage 3a: Secondary | ICD-10-CM | POA: Diagnosis not present

## 2024-01-04 DIAGNOSIS — E1165 Type 2 diabetes mellitus with hyperglycemia: Secondary | ICD-10-CM | POA: Diagnosis not present

## 2024-01-04 DIAGNOSIS — F17210 Nicotine dependence, cigarettes, uncomplicated: Secondary | ICD-10-CM | POA: Diagnosis not present

## 2024-01-04 DIAGNOSIS — R739 Hyperglycemia, unspecified: Secondary | ICD-10-CM | POA: Diagnosis not present

## 2024-01-15 DIAGNOSIS — N1831 Chronic kidney disease, stage 3a: Secondary | ICD-10-CM | POA: Diagnosis not present

## 2024-01-15 DIAGNOSIS — E1165 Type 2 diabetes mellitus with hyperglycemia: Secondary | ICD-10-CM | POA: Diagnosis not present

## 2024-01-16 ENCOUNTER — Other Ambulatory Visit: Payer: Self-pay | Admitting: Student in an Organized Health Care Education/Training Program

## 2024-01-22 DIAGNOSIS — B369 Superficial mycosis, unspecified: Secondary | ICD-10-CM | POA: Diagnosis not present

## 2024-01-22 DIAGNOSIS — L739 Follicular disorder, unspecified: Secondary | ICD-10-CM | POA: Diagnosis not present

## 2024-01-22 DIAGNOSIS — G894 Chronic pain syndrome: Secondary | ICD-10-CM | POA: Diagnosis not present

## 2024-01-22 DIAGNOSIS — Z0000 Encounter for general adult medical examination without abnormal findings: Secondary | ICD-10-CM | POA: Diagnosis not present

## 2024-01-22 DIAGNOSIS — F17210 Nicotine dependence, cigarettes, uncomplicated: Secondary | ICD-10-CM | POA: Diagnosis not present

## 2024-01-22 DIAGNOSIS — L309 Dermatitis, unspecified: Secondary | ICD-10-CM | POA: Diagnosis not present

## 2024-01-22 DIAGNOSIS — D72829 Elevated white blood cell count, unspecified: Secondary | ICD-10-CM | POA: Diagnosis not present

## 2024-01-23 ENCOUNTER — Encounter: Payer: Self-pay | Admitting: Student in an Organized Health Care Education/Training Program

## 2024-01-23 ENCOUNTER — Ambulatory Visit
Payer: No Typology Code available for payment source | Attending: Student in an Organized Health Care Education/Training Program | Admitting: Student in an Organized Health Care Education/Training Program

## 2024-01-23 VITALS — BP 144/94 | HR 73 | Temp 96.6°F | Resp 16 | Ht 70.0 in | Wt 224.0 lb

## 2024-01-23 DIAGNOSIS — G894 Chronic pain syndrome: Secondary | ICD-10-CM | POA: Diagnosis not present

## 2024-01-23 DIAGNOSIS — Z87828 Personal history of other (healed) physical injury and trauma: Secondary | ICD-10-CM | POA: Diagnosis not present

## 2024-01-23 DIAGNOSIS — Z8781 Personal history of (healed) traumatic fracture: Secondary | ICD-10-CM | POA: Insufficient documentation

## 2024-01-23 DIAGNOSIS — Z720 Tobacco use: Secondary | ICD-10-CM | POA: Diagnosis not present

## 2024-01-23 DIAGNOSIS — F411 Generalized anxiety disorder: Secondary | ICD-10-CM | POA: Diagnosis not present

## 2024-01-23 DIAGNOSIS — M170 Bilateral primary osteoarthritis of knee: Secondary | ICD-10-CM | POA: Insufficient documentation

## 2024-01-23 DIAGNOSIS — M47816 Spondylosis without myelopathy or radiculopathy, lumbar region: Secondary | ICD-10-CM | POA: Diagnosis not present

## 2024-01-23 MED ORDER — GABAPENTIN 800 MG PO TABS: 800.0000 mg | ORAL_TABLET | Freq: Three times a day (TID) | ORAL | 5 refills | Status: DC

## 2024-01-23 MED ORDER — OXYCODONE-ACETAMINOPHEN 5-325 MG PO TABS: 1.0000 | ORAL_TABLET | Freq: Four times a day (QID) | ORAL | 0 refills | Status: DC | PRN

## 2024-01-23 MED ORDER — OXYCODONE-ACETAMINOPHEN 5-325 MG PO TABS: 1.0000 | ORAL_TABLET | Freq: Four times a day (QID) | ORAL | 0 refills | Status: AC | PRN

## 2024-01-23 MED ORDER — TIZANIDINE HCL 4 MG PO TABS: 4.0000 mg | ORAL_TABLET | Freq: Three times a day (TID) | ORAL | 5 refills | Status: DC | PRN

## 2024-01-23 NOTE — Progress Notes (Signed)
Nursing Pain Medication Assessment:  Safety precautions to be maintained throughout the outpatient stay will include: orient to surroundings, keep bed in low position, maintain call bell within reach at all times, provide assistance with transfer out of bed and ambulation.  Medication Inspection Compliance: Pill count conducted under aseptic conditions, in front of the patient. Neither the pills nor the bottle was removed from the patient's sight at any time. Once count was completed pills were immediately returned to the patient in their original bottle.  Medication: Oxycodone/APAP Pill/Patch Count:  12 of 120 pills remain Pill/Patch Appearance: Markings consistent with prescribed medication Bottle Appearance: Standard pharmacy container. Clearly labeled. Filled Date: 01 / 23 / 2025 Last Medication intake:  Today
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
## 2024-01-30 DIAGNOSIS — E1165 Type 2 diabetes mellitus with hyperglycemia: Secondary | ICD-10-CM | POA: Diagnosis not present

## 2024-01-30 DIAGNOSIS — F411 Generalized anxiety disorder: Secondary | ICD-10-CM | POA: Diagnosis not present

## 2024-01-30 DIAGNOSIS — N1831 Chronic kidney disease, stage 3a: Secondary | ICD-10-CM | POA: Diagnosis not present

## 2024-04-15 NOTE — Progress Notes (Unsigned)
 PROVIDER NOTE: Interpretation of information contained herein should be left to medically-trained personnel. Specific patient instructions are provided elsewhere under "Patient Instructions" section of medical record. This document was created in part using AI and STT-dictation technology, any transcriptional errors that may result from this process are unintentional.  [name]  Service: E/M   PCP: Christopher Baumgarten, MD  DOB: Apr 08, 1961  DOS: 04/16/2024  Provider: Cherylin Corrigan, NP  MRN: [id number]  Delivery: Face-to-face  Specialty: Interventional Pain Management  Type: Established Patient  Setting: Ambulatory outpatient facility  Specialty designation: 09  Referring Prov.: Christopher Baumgarten, MD  Location: Outpatient office facility       HPI  [name], a 63 y.o. year old male, is here today because of his No primary diagnosis found.. [name] primary complain today is No chief complaint on file.   Pain Assessment: Severity of   is reported as a  /10. Location:    / . Onset:  . Quality:  . Timing:  . Modifying factor(s):  Aaron Aas Vitals:  vitals were not taken for this visit.  BMI: Estimated body mass index is 32.14 kg/m as calculated from the following:   Height as of 01/23/24: 5\' 10"  (1.778 m).   Weight as of 01/23/24: 224 lb (101.6 kg). Last encounter: 01/23/2024.  Reason for encounter: medication management. No change in medical history since last visit.  Patient's pain is at baseline.  Patient continues multimodal pain regimen as prescribed.  States that it provides pain relief and improvement in functional status.   Pharmacotherapy Assessment  Analgesic: {There is no content from the last Subjective section.}   Monitoring: [place] PMP: PDMP not reviewed this encounter.       Pharmacotherapy: No side-effects or adverse reactions reported. Compliance: No problems identified. Effectiveness: Clinically acceptable.  No notes on file  No results found for: "CBDTHCR" No  results found for: "D8THCCBX" No results found for: "D9THCCBX"  UDS:  Summary  Date Value Ref Range Status  07/25/2023 Note  Final    Comment:    ==================================================================== [facility] (MW) ==================================================================== Test                             Result       Flag       Units  Drug Present and Declared for Prescription Verification   Oxycodone                       3735         EXPECTED   ng/mg creat   Oxymorphone                    868          EXPECTED   ng/mg creat   Noroxycodone                   1371         EXPECTED   ng/mg creat   Noroxymorphone                 132          EXPECTED   ng/mg creat    Sources of oxycodone  are scheduled prescription medications.    Oxymorphone, noroxycodone, and noroxymorphone are expected    metabolites of oxycodone . Oxymorphone is also available as a    scheduled prescription medication.  ==================================================================== Test  Result    Flag   Units      Ref Range   Creatinine              226              mg/dL      >=16 ==================================================================== Declared Medications:  The flagging and interpretation on this report are based on the  following declared medications.  Unexpected results may arise from  inaccuracies in the declared medications.   **Note: The testing scope of this panel includes these medications:   Oxycodone  (Percocet)   **Note: The testing scope of this panel does not include the  following reported medications:   Acetaminophen  (Tylenol )  Acetaminophen  (Percocet)  Albuterol (Ventolin HFA)  Budesonide (Symbicort)  Cyclosporine  Doxycycline (Vibramycin)  Eye Drops  Fluconazole (Diflucan)  Formoterol (Symbicort)  Gabapentin   Ibuprofen  (Advil )  Magnesium  Multivitamin  Nystatin  Sitagliptin (Januvia)  Tizanidine   (Zanaflex ) ==================================================================== For clinical consultation, please call [phone]. ====================================================================       ROS  Constitutional: Denies any fever or chills Gastrointestinal: No reported hemesis, hematochezia, vomiting, or acute GI distress Musculoskeletal: Denies any acute onset joint swelling, redness, loss of ROM, or weakness Neurological: No reported episodes of acute onset apraxia, aphasia, dysarthria, agnosia, amnesia, paralysis, loss of coordination, or loss of consciousness  Medication Review  CVS Lumbar/Back Support Brace, Glucosamine-Chondroitin-MSM, Lifitegrast, Magnesium, acetaminophen , albuterol, atorvastatin, budesonide-formoterol, clotrimazole, cycloSPORINE (PF), gabapentin , ibuprofen , multivitamin, nystatin cream, oxyCODONE -acetaminophen , sitaGLIPtin, and tiZANidine   History Review  Allergy: [name] is allergic to amoxicillin. Drug: [name]  reports no history of drug use. Alcohol:  has no history on file for alcohol use. Tobacco:  reports that he has been smoking cigarettes. He uses smokeless tobacco. Social: [name]  reports that he has been smoking cigarettes. He uses smokeless tobacco. He reports that he does not use drugs. Medical:  has a past medical history of Folliculitis, GAD (generalized anxiety disorder), History of fracture of pelvis, Hypertriglyceridemia, Medical history reviewed with no changes, and Sciatic nerve pain. Surgical: [name]  has a past surgical history that includes Knee surgery (Bilateral) and Bony pelvis surgery (Left). Family: family history is not on file.  Laboratory Chemistry Profile   Renal No results found for: "BUN", "CREATININE", "LABCREA", "BCR", "GFR", "GFRAA", "GFRNONAA", "LABVMA", "EPIRU", "EPINEPH24HUR", "NOREPRU", "NOREPI24HUR", "DOPARU", "DOPAM24HRUR"  Hepatic No results found for: "AST", "ALT", "ALBUMIN", "ALKPHOS",  "HCVAB", "AMYLASE", "LIPASE", "AMMONIA"  Electrolytes No results found for: "NA", "K", "CL", "CALCIUM", "MG", "PHOS"  Bone Lab Results  Component Value Date   TESTOSTERONE  304 10/02/2019    Inflammation (CRP: Acute Phase) (ESR: Chronic Phase) No results found for: "CRP", "ESRSEDRATE", "LATICACIDVEN"       Note: Above Lab results reviewed.  Recent Imaging Review  MR BRAIN W WO CONTRAST CLINICAL DATA:  Low testosterone , elevated prolactin  EXAM: MRI HEAD WITHOUT AND WITH CONTRAST  TECHNIQUE: Multiplanar, multiecho pulse sequences of the brain and surrounding structures were obtained without and with intravenous contrast.  CONTRAST:  10mL GADAVIST  GADOBUTROL  1 MMOL/ML IV SOLN  COMPARISON:  None.  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 There is no sellar or suprasellar mass. Pituitary infundibulum is midline. There is no acute infarction or intracranial hemorrhage. There is no intracranial mass, mass effect, or edema. There is no hydrocephalus or extra-axial fluid collection. Prominence of the ventricles and sulci reflects mild generalized parenchymal volume loss. Patchy T2 hyperintensity in the supratentorial white matter is nonspecific but may reflect mild chronic  microvascular ischemic changes. No abnormal enhancement.  Vascular: Major vessel flow voids at the skull base are preserved.  Skull and upper cervical spine: Normal marrow signal is preserved.  Sinuses/Orbits: Mild to moderate paranasal sinus mucosal thickening with some aerosolized secretions in the right maxillary sinus. Orbits are unremarkable.  Other: Left mastoid tip fluid opacification.  IMPRESSION: No sellar or suprasellar mass.  Mild chronic microvascular ischemic changes and cerebral volume loss.  Electronically Signed   By: Geannie Keener M.D.   On: 07/18/2020 13:59 Note: Reviewed         Physical Exam  General appearance: Well nourished, well developed, and well hydrated. In  no apparent acute distress Mental status: Alert, oriented x 3 (person, place, & time)       Respiratory: No evidence of acute respiratory distress Eyes: PERLA Vitals: There were no vitals taken for this visit. BMI: Estimated body mass index is 32.14 kg/m as calculated from the following:   Height as of 01/23/24: 5\' 10"  (1.778 m).   Weight as of 01/23/24: 224 lb (101.6 kg). Ideal: Patient weight not recorded  Assessment   Diagnosis Status  No diagnosis found. Controlled Controlled Controlled   Plan of Care  Assessment and Plan             Pharmacotherapy (Medications Ordered): No orders of the defined types were placed in this encounter.  Orders:  No orders of the defined types were placed in this encounter.  Follow-up plan:   No follow-ups on file.    Recent Visits Date Type Provider Dept  01/23/24 Office Visit Cephus Collin, MD [facility]  Showing recent visits within past 90 days and meeting all other requirements Future Appointments Date Type Provider Dept  04/16/24 Appointment Sydni Elizarraraz K, NP [facility]  Showing future appointments within next 90 days and meeting all other requirements  I discussed the assessment and treatment plan with the patient. The patient was provided an opportunity to ask questions and all were answered. The patient agreed with the plan and demonstrated an understanding of the instructions.  Patient advised to call back or seek an in-person evaluation if the symptoms or condition worsens.  Duration of encounter: *** minutes.  Total time on encounter, as per AMA guidelines included both the face-to-face and non-face-to-face time personally spent by the physician and/or other qualified health care professional(s) on the day of the encounter (includes time in activities that require the physician or other qualified health care professional and does not include time in activities normally performed by clinical staff).  Physician's time may include the following activities when performed: Preparing to see the patient (e.g., pre-charting review of records, searching for previously ordered imaging, lab work, and nerve conduction tests) Review of prior analgesic pharmacotherapies. Reviewing PMP Interpreting ordered tests (e.g., lab work, imaging, nerve conduction tests) Performing post-procedure evaluations, including interpretation of diagnostic procedures Obtaining and/or reviewing separately obtained history Performing a medically appropriate examination and/or evaluation Counseling and educating the patient/family/caregiver Ordering medications, tests, or procedures Referring and communicating with other health care professionals (when not separately reported) Documenting clinical information in the electronic or other health record Independently interpreting results (not separately reported) and communicating results to the patient/ family/caregiver Care coordination (not separately reported)  Note by: Creta Dorame K Adarsh Mundorf, NP (TTS and AI technology used. I apologize for any typographical errors that were not detected and corrected.) Date: 04/16/2024; Time: 12:12 PM

## 2024-04-16 ENCOUNTER — Encounter: Payer: Self-pay | Admitting: Nurse Practitioner

## 2024-04-16 ENCOUNTER — Ambulatory Visit
Payer: No Typology Code available for payment source | Attending: Student in an Organized Health Care Education/Training Program | Admitting: Nurse Practitioner

## 2024-04-16 VITALS — BP 119/93 | HR 85 | Temp 96.1°F | Resp 16 | Ht 70.0 in | Wt 215.0 lb

## 2024-04-16 DIAGNOSIS — F411 Generalized anxiety disorder: Secondary | ICD-10-CM | POA: Insufficient documentation

## 2024-04-16 DIAGNOSIS — Z720 Tobacco use: Secondary | ICD-10-CM | POA: Insufficient documentation

## 2024-04-16 DIAGNOSIS — M47816 Spondylosis without myelopathy or radiculopathy, lumbar region: Secondary | ICD-10-CM | POA: Diagnosis not present

## 2024-04-16 DIAGNOSIS — Z8781 Personal history of (healed) traumatic fracture: Secondary | ICD-10-CM | POA: Insufficient documentation

## 2024-04-16 DIAGNOSIS — Z79899 Other long term (current) drug therapy: Secondary | ICD-10-CM | POA: Insufficient documentation

## 2024-04-16 DIAGNOSIS — G894 Chronic pain syndrome: Secondary | ICD-10-CM | POA: Insufficient documentation

## 2024-04-16 DIAGNOSIS — Z87828 Personal history of other (healed) physical injury and trauma: Secondary | ICD-10-CM | POA: Diagnosis not present

## 2024-04-16 DIAGNOSIS — M170 Bilateral primary osteoarthritis of knee: Secondary | ICD-10-CM | POA: Insufficient documentation

## 2024-04-16 MED ORDER — OXYCODONE-ACETAMINOPHEN 5-325 MG PO TABS: 1.0000 | ORAL_TABLET | Freq: Four times a day (QID) | ORAL | 0 refills | Status: AC | PRN

## 2024-04-16 MED ORDER — IBUPROFEN 800 MG PO TABS: 800.0000 mg | ORAL_TABLET | Freq: Two times a day (BID) | ORAL | 5 refills | Status: DC | PRN

## 2024-04-16 MED ORDER — TIZANIDINE HCL 4 MG PO TABS: 4.0000 mg | ORAL_TABLET | Freq: Three times a day (TID) | ORAL | 5 refills | Status: AC | PRN

## 2024-04-16 MED ORDER — OXYCODONE-ACETAMINOPHEN 5-325 MG PO TABS: 1.0000 | ORAL_TABLET | Freq: Four times a day (QID) | ORAL | 0 refills | Status: DC | PRN

## 2024-04-16 NOTE — Progress Notes (Signed)
 Nursing Pain Medication Assessment:  Safety precautions to be maintained throughout the outpatient stay will include: orient to surroundings, keep bed in low position, maintain call bell within reach at all times, provide assistance with transfer out of bed and ambulation.  Medication Inspection Compliance: Pill count conducted under aseptic conditions, in front of the patient. Neither the pills nor the bottle was removed from the patient's sight at any time. Once count was completed pills were immediately returned to the patient in their original bottle.  Medication: Oxycodone /APAP Pill/Patch Count: 36 of 120 pills/patches remain Pill/Patch Appearance: Markings consistent with prescribed medication Bottle Appearance: Standard pharmacy container. Clearly labeled. Filled Date: 04 / 23 / 2025 Last Medication intake:  Today

## 2024-05-14 DIAGNOSIS — D72829 Elevated white blood cell count, unspecified: Secondary | ICD-10-CM | POA: Diagnosis not present

## 2024-05-14 DIAGNOSIS — Z0000 Encounter for general adult medical examination without abnormal findings: Secondary | ICD-10-CM | POA: Diagnosis not present

## 2024-05-14 DIAGNOSIS — B369 Superficial mycosis, unspecified: Secondary | ICD-10-CM | POA: Diagnosis not present

## 2024-05-14 DIAGNOSIS — L309 Dermatitis, unspecified: Secondary | ICD-10-CM | POA: Diagnosis not present

## 2024-05-14 DIAGNOSIS — G894 Chronic pain syndrome: Secondary | ICD-10-CM | POA: Diagnosis not present

## 2024-05-14 DIAGNOSIS — Z720 Tobacco use: Secondary | ICD-10-CM | POA: Diagnosis not present

## 2024-05-14 DIAGNOSIS — L739 Follicular disorder, unspecified: Secondary | ICD-10-CM | POA: Diagnosis not present

## 2024-05-21 DIAGNOSIS — R7309 Other abnormal glucose: Secondary | ICD-10-CM | POA: Diagnosis not present

## 2024-05-21 DIAGNOSIS — J209 Acute bronchitis, unspecified: Secondary | ICD-10-CM | POA: Diagnosis not present

## 2024-05-21 DIAGNOSIS — D72829 Elevated white blood cell count, unspecified: Secondary | ICD-10-CM | POA: Diagnosis not present

## 2024-05-21 DIAGNOSIS — F411 Generalized anxiety disorder: Secondary | ICD-10-CM | POA: Diagnosis not present

## 2024-05-21 DIAGNOSIS — F17210 Nicotine dependence, cigarettes, uncomplicated: Secondary | ICD-10-CM | POA: Diagnosis not present

## 2024-05-21 DIAGNOSIS — Z6830 Body mass index (BMI) 30.0-30.9, adult: Secondary | ICD-10-CM | POA: Diagnosis not present

## 2024-05-21 DIAGNOSIS — G894 Chronic pain syndrome: Secondary | ICD-10-CM | POA: Diagnosis not present

## 2024-05-21 DIAGNOSIS — Z1331 Encounter for screening for depression: Secondary | ICD-10-CM | POA: Diagnosis not present

## 2024-05-21 DIAGNOSIS — Z23 Encounter for immunization: Secondary | ICD-10-CM | POA: Diagnosis not present

## 2024-05-21 DIAGNOSIS — Z0000 Encounter for general adult medical examination without abnormal findings: Secondary | ICD-10-CM | POA: Diagnosis not present

## 2024-07-16 ENCOUNTER — Ambulatory Visit: Attending: Nurse Practitioner | Admitting: Student in an Organized Health Care Education/Training Program

## 2024-07-16 ENCOUNTER — Encounter: Payer: Self-pay | Admitting: Student in an Organized Health Care Education/Training Program

## 2024-07-16 VITALS — BP 138/78 | HR 87 | Temp 96.6°F | Ht 70.0 in | Wt 220.0 lb

## 2024-07-16 DIAGNOSIS — Z79891 Long term (current) use of opiate analgesic: Secondary | ICD-10-CM | POA: Insufficient documentation

## 2024-07-16 DIAGNOSIS — G894 Chronic pain syndrome: Secondary | ICD-10-CM | POA: Insufficient documentation

## 2024-07-16 DIAGNOSIS — Z8781 Personal history of (healed) traumatic fracture: Secondary | ICD-10-CM | POA: Diagnosis not present

## 2024-07-16 DIAGNOSIS — M47816 Spondylosis without myelopathy or radiculopathy, lumbar region: Secondary | ICD-10-CM | POA: Insufficient documentation

## 2024-07-16 MED ORDER — OXYCODONE-ACETAMINOPHEN 5-325 MG PO TABS: 1.0000 | ORAL_TABLET | Freq: Four times a day (QID) | ORAL | 0 refills | Status: AC | PRN

## 2024-07-16 MED ORDER — GABAPENTIN 800 MG PO TABS: 800.0000 mg | ORAL_TABLET | Freq: Three times a day (TID) | ORAL | 5 refills | Status: AC

## 2024-07-16 MED ORDER — OXYCODONE-ACETAMINOPHEN 5-325 MG PO TABS: 1.0000 | ORAL_TABLET | Freq: Four times a day (QID) | ORAL | 0 refills | Status: DC | PRN

## 2024-07-16 NOTE — Progress Notes (Signed)
 PROVIDER NOTE: Information contained herein reflects review and annotations entered in association with encounter. Interpretation of such information and data should be left to medically-trained personnel. Information provided to patient can be located elsewhere in the medical record under Patient Instructions. Document created using STT-dictation technology, any transcriptional errors that may result from process are unintentional.    [name]  Service Category: E/M  Provider: Wallie Sherry, MD  DOB: Jan 19, 1961  DOS: 07/16/2024  Specialty: Interventional Pain Management  MRN: [id number]  Setting: Ambulatory outpatient  PCP: Christopher Manna, MD  Type: Established Patient    Referring Provider: Sadie Manna, MD  Location: Office  Delivery: Face-to-face     HPI  [name], a 64 y.o. year old male, is here today because of his Lumbar facet arthropathy [M47.816]. [name] primary complain today is Back Pain   Pertinent problems: [name] has History of pelvic fracture; Chronic midline low back pain without sciatica; Tobacco user; Lumbar facet arthropathy; Chronic pain syndrome; Bilateral primary osteoarthritis of knee; and Pain management contract signed on their pertinent problem list. Pain Assessment: Severity of Chronic pain is reported as a 2 /10. Location: Generalized Other (Comment) (Multiple pain complaints)/Denies. Onset: More than a month ago. Quality: Constant, Discomfort. Timing: Intermittent. Modifying factor(s): Medications. Vitals:  height is 5' 10 (1.778 m) and weight is 220 lb (99.8 kg). His temperature is 96.6 F (35.9 C) (abnormal). His blood pressure is 138/78 and his pulse is 87. His oxygen saturation is 97%.   Reason for encounter: medication management.    No change in medical history since last visit.  Patient's pain is at baseline.  Patient continues multimodal pain regimen as prescribed.  States that it provides pain relief and improvement in  functional status.  Pharmacotherapy Assessment  Analgesic: Percocet 5 mg  every 6 hours PRN MME 30 #120   Monitoring: [place] PMP: PDMP reviewed during this encounter.       Pharmacotherapy: No side-effects or adverse reactions reported. Compliance: No problems identified. Effectiveness: Clinically acceptable.  UDS:  Summary  Date Value Ref Range Status  07/25/2023 Note  Final    Comment:    ==================================================================== [employer] Select 13 (MW) ==================================================================== Test                             Result       Flag       Units  Drug Present and Declared for Prescription Verification   Oxycodone                       3735         EXPECTED   ng/mg creat   Oxymorphone                    868          EXPECTED   ng/mg creat   Noroxycodone                   1371         EXPECTED   ng/mg creat   Noroxymorphone                 132          EXPECTED   ng/mg creat    Sources of oxycodone  are scheduled prescription medications.    Oxymorphone, noroxycodone, and noroxymorphone are expected    metabolites of oxycodone . Oxymorphone is also  available as a    scheduled prescription medication.  ==================================================================== Test                      Result    Flag   Units      Ref Range   Creatinine              226              mg/dL      >=79 ==================================================================== Declared Medications:  The flagging and interpretation on this report are based on the  following declared medications.  Unexpected results may arise from  inaccuracies in the declared medications.   **Note: The testing scope of this panel includes these medications:   Oxycodone  (Percocet)   **Note: The testing scope of this panel does not include the  following reported medications:   Acetaminophen  (Tylenol )  Acetaminophen  (Percocet)  Albuterol (Ventolin  HFA)  Budesonide (Symbicort)  Cyclosporine  Doxycycline (Vibramycin)  Eye Drops  Fluconazole (Diflucan)  Formoterol (Symbicort)  Gabapentin   Ibuprofen  (Advil )  Magnesium  Multivitamin  Nystatin  Sitagliptin (Januvia)  Tizanidine  (Zanaflex ) ==================================================================== For clinical consultation, please call [phone]. ====================================================================       ROS  Constitutional: Denies any fever or chills Gastrointestinal: No reported hemesis, hematochezia, vomiting, or acute GI distress Musculoskeletal: +LBP Neurological: No reported episodes of acute onset apraxia, aphasia, dysarthria, agnosia, amnesia, paralysis, loss of coordination, or loss of consciousness  Medication Review  [employer]S Lumbar/Back Support Brace, Glucosamine-Chondroitin-MSM, Lifitegrast, Magnesium, acetaminophen , albuterol, atorvastatin, budesonide-formoterol, clotrimazole, cycloSPORINE (PF), gabapentin , ibuprofen , multivitamin, nystatin cream, oxyCODONE -acetaminophen , sitaGLIPtin, and tiZANidine   History Review  Allergy: [name] is allergic to amoxicillin. Drug: [name]  reports no history of drug use. Alcohol:  has no history on file for alcohol use. Tobacco:  reports that he has been smoking cigarettes. He uses smokeless tobacco. Social: Christopher Mcneil  reports that he has been smoking cigarettes. He uses smokeless tobacco. He reports that he does not use drugs. Medical:  has a past medical history of Folliculitis, GAD (generalized anxiety disorder), History of fracture of pelvis, Hypertriglyceridemia, Medical history reviewed with no changes, and Sciatic nerve pain. Surgical: [name]  has a past surgical history that includes Knee surgery (Bilateral) and Bony pelvis surgery (Left). Family: family history is not on file.  Laboratory Chemistry Profile   Renal No results found for: BUN, CREATININE, LABCREA, BCR, GFR,  GFRAA, GFRNONAA, LABVMA, EPIRU, EPINEPH24HUR, NOREPRU, NOREPI24HUR, DOPARU, [id number]   Hepatic No results found for: AST, ALT, ALBUMIN, ALKPHOS, HCVAB, AMYLASE, LIPASE, AMMONIA   Electrolytes No results found for: NA, K, CL, CALCIUM, MG, PHOS   Bone Lab Results  Component Value Date   TESTOSTERONE  304 10/02/2019     Inflammation (CRP: Acute Phase) (ESR: Chronic Phase) No results found for: CRP, ESRSEDRATE, LATICACIDVEN     Note: Above Lab results reviewed.  Recent Imaging Review  MR BRAIN W WO CONTRAST CLINICAL DATA:  Low testosterone , elevated prolactin  EXAM: MRI HEAD WITHOUT AND WITH CONTRAST  TECHNIQUE: Multiplanar, multiecho pulse sequences of the brain and surrounding structures were obtained without and with intravenous contrast.  CONTRAST:  10mL GADAVIST  GADOBUTROL  1 MMOL/ML IV SOLN  COMPARISON:  None.  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 There is no sellar or suprasellar mass. Pituitary infundibulum is midline. There is no acute infarction or intracranial hemorrhage. There is no intracranial mass, mass effect, or edema. There is no hydrocephalus or extra-axial fluid collection. Prominence of the  ventricles and sulci reflects mild generalized parenchymal volume loss. Patchy T2 hyperintensity in the supratentorial white matter is nonspecific but may reflect mild chronic microvascular ischemic changes. No abnormal enhancement.  Vascular: Major vessel flow voids at the skull base are preserved.  Skull and upper cervical spine: Normal marrow signal is preserved.  Sinuses/Orbits: Mild to moderate paranasal sinus mucosal thickening with some aerosolized secretions in the right maxillary sinus. Orbits are unremarkable.  Other: Left mastoid tip fluid opacification.  IMPRESSION: No sellar or suprasellar mass.  Mild chronic microvascular ischemic changes and cerebral  volume loss.  Electronically Signed   By: Santina Blanch M.D.   On: 07/18/2020 13:59  Note: Reviewed        Physical Exam  General appearance: Well nourished, well developed, and well hydrated. In no apparent acute distress Mental status: Alert, oriented x 3 (person, place, & time)       Respiratory: No evidence of acute respiratory distress Eyes: PERLA Vitals: BP 138/78   Pulse 87   Temp (!) 96.6 F (35.9 C)   Ht 5' 10 (1.778 m)   Wt 220 lb (99.8 kg)   SpO2 97%   BMI 31.57 kg/m  BMI: Estimated body mass index is 31.57 kg/m as calculated from the following:   Height as of this encounter: 5' 10 (1.778 m).   Weight as of this encounter: 220 lb (99.8 kg). Ideal: Ideal body weight: 73 kg (160 lb 15 oz) Adjusted ideal body weight: 83.7 kg (184 lb 9 oz)   Lumbar Exam  Skin & Axial Inspection: lumbar support brace in place Alignment: Symmetrical Functional ROM: Pain restricted ROM       Stability: No instability detected Muscle Tone/Strength: Functionally intact. No obvious neuro-muscular anomalies detected. Sensory (Neurological): Musculoskeletal pain pattern Palpation: No palpable anomalies       Provocative Tests: Hyperextension/rotation test: (+) bilaterally for facet joint pain. Lumbar quadrant test (Kemp's test): (+) bilaterally for facet joint pain.  Gait & Posture Assessment  Ambulation: Unassisted Gait: Relatively normal for age and body habitus Posture: WNL    Lower Extremity Exam      Side: Right lower extremity   Side: Left lower extremity  Stability: No instability observed           Stability: No instability observed          Skin & Extremity Inspection: Evidence of prior arthroplastic surgery   Skin & Extremity Inspection: Evidence of prior arthroplastic surgery  Functional ROM: Pain restricted ROM for hip and knee joints           Functional ROM: Pain restricted ROM for hip and knee joints          Muscle Tone/Strength: Functionally intact. No obvious  neuro-muscular anomalies detected.   Muscle Tone/Strength: Functionally intact. No obvious neuro-muscular anomalies detected.  Sensory (Neurological): Musculoskeletal pain pattern         Sensory (Neurological): Musculoskeletal pain pattern        DTR: Patellar: deferred today Achilles: deferred today Plantar: deferred today   DTR: Patellar: deferred today Achilles: deferred today Plantar: deferred today  Palpation: No palpable anomalies   Palpation: No palpable anomalies   Assessment   Status Diagnosis  Controlled Controlled Controlled 1. Lumbar facet arthropathy   2. Chronic pain syndrome   3. History of pelvic fracture   4. Encounter for long-term opiate analgesic use       Plan of Care  [name] has a current medication list which includes  the following long-term medication(s): albuterol, atorvastatin, sitagliptin, budesonide-formoterol, and gabapentin .  Pharmacotherapy (Medications Ordered): Meds ordered this encounter  Medications   oxyCODONE -acetaminophen  (PERCOCET) 5-325 MG tablet    Sig: Take 1 tablet by mouth every 6 (six) hours as needed for severe pain (pain score 7-10). Must last 30 days.    Dispense:  120 tablet    Refill:  0    Chronic Pain. (STOP Act - Not applicable). Fill one day early if closed on scheduled refill date.   oxyCODONE -acetaminophen  (PERCOCET) 5-325 MG tablet    Sig: Take 1 tablet by mouth every 6 (six) hours as needed for severe pain (pain score 7-10). Must last 30 days.    Dispense:  120 tablet    Refill:  0    Chronic Pain. (STOP Act - Not applicable). Fill one day early if closed on scheduled refill date.   oxyCODONE -acetaminophen  (PERCOCET) 5-325 MG tablet    Sig: Take 1 tablet by mouth every 6 (six) hours as needed for severe pain (pain score 7-10). Must last 30 days.    Dispense:  120 tablet    Refill:  0    Chronic Pain. (STOP Act - Not applicable). Fill one day early if closed on scheduled refill date.   gabapentin   (NEURONTIN ) 800 MG tablet    Sig: Take 1 tablet (800 mg total) by mouth 3 (three) times daily.    Dispense:  90 tablet    Refill:  5    Fill one day early if pharmacy is closed on scheduled refill date. May substitute for generic if available.    Patient cautioned on renal health with chronic NSAID use.  Update urine screen for medication compliance monitoring Orders Placed This Encounter  Procedures   [employer] Select 13 (MW), Urine    Volume: 30 ml(s). Minimum 3 ml of urine is needed. Document temperature of fresh sample. Indications: Long term (current) use of opiate analgesic (S20.108)    Release to patient:   Immediate     Follow-up plan:   Return in about 3 months (around 10/16/2024) for Emmy Blanch, MM.      Interventional management options:  Considering:   Diagnostic lumbar facet medial branch nerve blocks   PRN Procedures:   None at this time      Recent Visits No visits were found meeting these conditions. Showing recent visits within past 90 days and meeting all other requirements Today's Visits Date Type Provider Dept  07/16/24 Office Visit Marcelino Nurse, MD [facility]  Showing today's visits and meeting all other requirements Future Appointments No visits were found meeting these conditions. Showing future appointments within next 90 days and meeting all other requirements  I discussed the assessment and treatment plan with the patient. The patient was provided an opportunity to ask questions and all were answered. The patient agreed with the plan and demonstrated an understanding of the instructions.  Patient advised to call back or seek an in-person evaluation if the symptoms or condition worsens.  Duration of encounter: .  Note by: Nurse Marcelino, MD Date: 07/16/2024; Time: 2:53 PM

## 2024-07-16 NOTE — Progress Notes (Signed)
 Nursing Pain Medication Assessment:  Safety precautions to be maintained throughout the outpatient stay will include: orient to surroundings, keep bed in low position, maintain call bell within reach at all times, provide assistance with transfer out of bed and ambulation.  Medication Inspection Compliance: Pill count conducted under aseptic conditions, in front of the patient. Neither the pills nor the bottle was removed from the patient's sight at any time. Once count was completed pills were immediately returned to the patient in their original bottle.  Medication: Oxycodone /APAP Pill/Patch Count: 34 of 120 pills/patches remain Pill/Patch Appearance: Markings consistent with prescribed medication Bottle Appearance: Standard pharmacy container. Clearly labeled. Filled Date: 07 / 22 / 2025 Last Medication intake:  Today

## 2024-07-18 LAB — TOXASSURE SELECT 13 (MW), URINE

## 2024-10-15 ENCOUNTER — Ambulatory Visit: Attending: Nurse Practitioner | Admitting: Nurse Practitioner

## 2024-10-15 ENCOUNTER — Encounter: Payer: Self-pay | Admitting: Nurse Practitioner

## 2024-10-15 VITALS — BP 153/76 | HR 88 | Temp 97.2°F | Resp 22 | Ht 70.0 in | Wt 220.0 lb

## 2024-10-15 DIAGNOSIS — M170 Bilateral primary osteoarthritis of knee: Secondary | ICD-10-CM | POA: Diagnosis present

## 2024-10-15 DIAGNOSIS — Z79899 Other long term (current) drug therapy: Secondary | ICD-10-CM | POA: Insufficient documentation

## 2024-10-15 DIAGNOSIS — M47816 Spondylosis without myelopathy or radiculopathy, lumbar region: Secondary | ICD-10-CM | POA: Insufficient documentation

## 2024-10-15 DIAGNOSIS — G894 Chronic pain syndrome: Secondary | ICD-10-CM | POA: Diagnosis present

## 2024-10-15 DIAGNOSIS — Z8781 Personal history of (healed) traumatic fracture: Secondary | ICD-10-CM | POA: Diagnosis not present

## 2024-10-15 DIAGNOSIS — Z79891 Long term (current) use of opiate analgesic: Secondary | ICD-10-CM | POA: Insufficient documentation

## 2024-10-15 MED ORDER — OXYCODONE-ACETAMINOPHEN 5-325 MG PO TABS: 1.0000 | ORAL_TABLET | Freq: Four times a day (QID) | ORAL | 0 refills | Status: AC | PRN

## 2024-10-15 MED ORDER — OXYCODONE-ACETAMINOPHEN 5-325 MG PO TABS
1.0000 | ORAL_TABLET | Freq: Four times a day (QID) | ORAL | 0 refills | Status: AC | PRN
Start: 2024-10-23 — End: 2024-11-22

## 2024-10-15 MED ORDER — NALOXONE HCL 4 MG/0.1ML NA LIQD: 1.0000 | NASAL | 1 refills | Status: AC | PRN

## 2024-10-15 NOTE — Progress Notes (Signed)
 Nursing Pain Medication Assessment:  Safety precautions to be maintained throughout the outpatient stay will include: orient to surroundings, keep bed in low position, maintain call bell within reach at all times, provide assistance with transfer out of bed and ambulation.  Medication Inspection Compliance: Pill count conducted under aseptic conditions, in front of the patient. Neither the pills nor the bottle was removed from the patient's sight at any time. Once count was completed pills were immediately returned to the patient in their original bottle.  Medication: Oxycodone /APAP Pill/Patch Count: 32 of 120 pills/patches remain Pill/Patch Appearance: Markings consistent with prescribed medication Bottle Appearance: Standard pharmacy container. Clearly labeled. Filled Date: 10 / 20 / 2025 Last Medication intake:  Today

## 2024-10-15 NOTE — Progress Notes (Signed)
 PROVIDER NOTE: Interpretation of information contained herein should be left to medically-trained personnel. Specific patient instructions are provided elsewhere under Patient Instructions section of medical record. This document was created in part using AI and STT-dictation technology, any transcriptional errors that may result from this process are unintentional.  [name]  Service: E/M   PCP: Sadie Manna, MD  DOB: [birth year]  DOS: 10/15/2024  Provider: Emmy MARLA Blanch, NP  MRN: [id number]  Delivery: Face-to-face  Specialty: Interventional Pain Management  Type: Established Patient  Setting: Ambulatory outpatient facility  Specialty designation: 09  Referring Prov.: Sadie Manna, MD  Location: Outpatient office facility       History of present illness (HPI) Mr. Aarnav Steagall, a 63 y.o. year old male, is here today because of his back pain and knee pain. Mr. Tesar primary complain today is Back Pain and Knee Pain  Pertinent problems: Mr. Rivero has History of pelvic fracture; Chronic midline low back pain without sciatica; Tobacco user; Lumbar facet arthropathy; Chronic pain syndrome; Bilateral primary osteoarthritis of knee; and Pain management contract signed on their pertinent problem list.   Pain Assessment: Severity of Chronic pain is reported as a 4 /10. Location: Generalized Other (Comment) (Multiples areas of pain)/Denies. Onset: More than a month ago. Quality: Constant. Timing: Constant. Modifying factor(s): Medication and Stretches. Vitals:  height is 5' 10 (1.778 m) and weight is 220 lb (99.8 kg). His temporal temperature is 97.2 F (36.2 C) (abnormal). His blood pressure is 153/76 (abnormal) and his pulse is 88. His respiration is 22 (abnormal) and oxygen saturation is 96%.  BMI: Estimated body mass index is 31.57 kg/m as calculated from the following:   Height as of this encounter: 5' 10 (1.778 m).   Weight as of this encounter: 220 lb (99.8 kg).  Last  encounter: 04/16/2024. Last procedure: Visit date not found.  Reason for encounter: medication management.   Discussed the use of AI scribe software for clinical note transcription with the patient, who gave verbal consent to proceed.  History of Present Illness   Christopher Mcneil is a 63 year old male who presents with pain in the ankle, back, ribs, and knees.  He experiences chronic pain primarily in the ankle, back, ribs, and knees, which he attributes to multiple knee surgeries over the years. The pain varies in severity, with some days being worse than others.  He is currently taking oxycodone  5 - 325 mg four times a day for pain management and denies experiencing any side effects from this medication. Additionally, he has been prescribed gabapentin , though he is unsure of the remaining quantity on his prescription.     Pharmacotherapy Assessment   Oxycodone -acetaminophen  (Percocet) 5-325 mg tablet every 6 hours as needed for severe pain. MME=30 Monitoring: [place] PMP: PDMP reviewed during this encounter.       Pharmacotherapy: No side-effects or adverse reactions reported. Compliance: No problems identified. Effectiveness: Clinically acceptable.  Christopher Mcneil [place], [place]  10/15/2024  1:57 PM  Sign when Signing Visit Nursing Pain Medication Assessment:  Safety precautions to be maintained throughout the outpatient stay will include: orient to surroundings, keep bed in low position, maintain call bell within reach at all times, provide assistance with transfer out of bed and ambulation.  Medication Inspection Compliance: Pill count conducted under aseptic conditions, in front of the patient. Neither the pills nor the bottle was removed from the patient's sight at any time. Once count was completed pills were immediately returned to the patient in their  original bottle.  Medication: Oxycodone /APAP Pill/Patch Count: 32 of 120 pills/patches remain Pill/Patch Appearance: Markings consistent with  prescribed medication Bottle Appearance: Standard pharmacy container. Clearly labeled. Filled Date: 10 / 20 / 2025 Last Medication intake:  Today    UDS:  Summary  Date Value Ref Range Status  07/16/2024 FINAL  Final    Comment:    ==================================================================== [employer] Select 13 (MW) ==================================================================== Test                             Result       Flag       Units  Drug Present and Declared for Prescription Verification   Oxycodone                       2528         EXPECTED   ng/mg creat   Oxymorphone                    162          EXPECTED   ng/mg creat   Noroxycodone                   2104         EXPECTED   ng/mg creat   Noroxymorphone                 105          EXPECTED   ng/mg creat    Sources of oxycodone  are scheduled prescription medications.    Oxymorphone, noroxycodone, and noroxymorphone are expected    metabolites of oxycodone . Oxymorphone is also available as a    scheduled prescription medication.  ==================================================================== Test                      Result    Flag   Units      Ref Range   Creatinine              200              mg/dL      >=79 ==================================================================== Declared Medications:  The flagging and interpretation on this report are based on the  following declared medications.  Unexpected results may arise from  inaccuracies in the declared medications.   **Note: The testing scope of this panel includes these medications:   Oxycodone  (Percocet)   **Note: The testing scope of this panel does not include the  following reported medications:   Acetaminophen  (Tylenol )  Acetaminophen  (Percocet)  Albuterol (Ventolin HFA)  Atorvastatin (Lipitor)  Budesonide (Symbicort)  Chondroitin  Eye Drops  Formoterol (Symbicort)  Gabapentin  (Neurontin )  Glucosamine  Ibuprofen   (Advil )  Magnesium  Multivitamin  Sitagliptin (Januvia)  Tizanidine  (Zanaflex )  Topical ==================================================================== For clinical consultation, please call [phone]. ====================================================================     No results found for: CBDTHCR No results found for: D8THCCBX No results found for: D9THCCBX  ROS  Constitutional: Denies any fever or chills Gastrointestinal: No reported hemesis, hematochezia, vomiting, or acute GI distress Musculoskeletal: back pain and knee pain Neurological: No reported episodes of acute onset apraxia, aphasia, dysarthria, agnosia, amnesia, paralysis, loss of coordination, or loss of consciousness  Medication Review  [employer] Lumbar/Back Support Brace, Glucosamine-Chondroitin-MSM, Lifitegrast, Magnesium, acetaminophen , albuterol, atorvastatin, budesonide-formoterol, clotrimazole, cycloSPORINE (PF), gabapentin , hydrocortisone, ibuprofen , multivitamin, naloxone, nystatin cream, oxyCODONE -acetaminophen , sitaGLIPtin, and tiZANidine   History Review  Allergy: Mr. Amparan is allergic to  amoxicillin. Drug: Mr. Hitz  reports no history of drug use. Alcohol:  has no history on file for alcohol use. Tobacco:  reports that he has been smoking cigarettes. He uses smokeless tobacco. Social: Mr. Elting  reports that he has been smoking cigarettes. He uses smokeless tobacco. He reports that he does not use drugs. Medical:  has a past medical history of Folliculitis, GAD (generalized anxiety disorder), History of fracture of pelvis, Hypertriglyceridemia, Medical history reviewed with no changes, and Sciatic nerve pain. Surgical: Mr. Shurley  has a past surgical history that includes Knee surgery (Bilateral) and Bony pelvis surgery (Left). Family: family history is not on file.  Laboratory Chemistry Profile   Renal No results found for: BUN, CREATININE, LABCREA, BCR, GFR, GFRAA,  GFRNONAA, LABVMA, EPIRU, EPINEPH24HUR, NOREPRU, NOREPI24HUR, DOPARU, [id number]  Hepatic No results found for: AST, ALT, ALBUMIN, ALKPHOS, HCVAB, AMYLASE, LIPASE, AMMONIA  Electrolytes No results found for: NA, K, CL, CALCIUM, MG, PHOS  Bone Lab Results  Component Value Date   TESTOSTERONE  304 10/02/2019    Inflammation (CRP: Acute Phase) (ESR: Chronic Phase) No results found for: CRP, ESRSEDRATE, LATICACIDVEN       Note: Above Lab results reviewed.  Recent Imaging Review  MR BRAIN W WO CONTRAST CLINICAL DATA:  Low testosterone , elevated prolactin  EXAM: MRI HEAD WITHOUT AND WITH CONTRAST  TECHNIQUE: Multiplanar, multiecho pulse sequences of the brain and surrounding structures were obtained without and with intravenous contrast.  CONTRAST:  10mL GADAVIST  GADOBUTROL  1 MMOL/ML IV SOLN  COMPARISON:  None.  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 There is no sellar or suprasellar mass. Pituitary infundibulum is midline. There is no acute infarction or intracranial hemorrhage. There is no intracranial mass, mass effect, or edema. There is no hydrocephalus or extra-axial fluid collection. Prominence of the ventricles and sulci reflects mild generalized parenchymal volume loss. Patchy T2 hyperintensity in the supratentorial white matter is nonspecific but may reflect mild chronic microvascular ischemic changes. No abnormal enhancement.  Vascular: Major vessel flow voids at the skull base are preserved.  Skull and upper cervical spine: Normal marrow signal is preserved.  Sinuses/Orbits: Mild to moderate paranasal sinus mucosal thickening with some aerosolized secretions in the right maxillary sinus. Orbits are unremarkable.  Other: Left mastoid tip fluid opacification.  IMPRESSION: No sellar or suprasellar mass.  Mild chronic microvascular ischemic changes and cerebral volume loss.  Electronically Signed    By: Santina Blanch M.D.   On: 07/18/2020 13:59 Note: Reviewed        Physical Exam  Vitals: BP (!) 153/76 (BP Location: Right Arm, Patient Position: Sitting, Cuff Size: Normal)   Pulse 88   Temp (!) 97.2 F (36.2 C) (Temporal)   Resp (!) 22   Ht 5' 10 (1.778 m)   Wt 220 lb (99.8 kg)   SpO2 96%   BMI 31.57 kg/m  BMI: Estimated body mass index is 31.57 kg/m as calculated from the following:   Height as of this encounter: 5' 10 (1.778 m).   Weight as of this encounter: 220 lb (99.8 kg). Ideal: Ideal body weight: 73 kg (160 lb 15 oz) Adjusted ideal body weight: 83.7 kg (184 lb 9 oz) General appearance: Well nourished, well developed, and well hydrated. In no apparent acute distress Mental status: Alert, oriented x 3 (person, place, & time)       Respiratory: No evidence of acute respiratory distress Eyes: PERLA  Musculoskeletal: +LBP Knee pain worse with prolong standing and walking  Lumbar Exam  Skin & Axial Inspection: lumbar support brace in place Alignment: Symmetrical Functional ROM: Pain restricted ROM       Stability: No instability detected Muscle Tone/Strength: Functionally intact. No obvious neuro-muscular anomalies detected. Sensory (Neurological): Musculoskeletal pain pattern Palpation: No palpable anomalies       Provocative Tests: Hyperextension/rotation test: (+) bilaterally for facet joint pain. Lumbar quadrant test (Kemp's test): (+) bilaterally for facet joint pain.   Gait & Posture Assessment  Ambulation: Unassisted Gait: Relatively normal for age and body habitus Posture: WNL    Lower Extremity Exam      Side: Right lower extremity   Side: Left lower extremity  Stability: No instability observed           Stability: No instability observed          Skin & Extremity Inspection: Evidence of prior arthroplastic surgery   Skin & Extremity Inspection: Evidence of prior arthroplastic surgery  Functional ROM: Pain restricted ROM for hip and knee joints            Functional ROM: Pain restricted ROM for hip and knee joints          Muscle Tone/Strength: Functionally intact. No obvious neuro-muscular anomalies detected.   Muscle Tone/Strength: Functionally intact. No obvious neuro-muscular anomalies detected.  Sensory (Neurological): Musculoskeletal pain pattern         Sensory (Neurological): Musculoskeletal pain pattern        DTR: Patellar: deferred today Achilles: deferred today Plantar: deferred today   DTR: Patellar: deferred today Achilles: deferred today Plantar: deferred today  Palpation: No palpable anomalies   Palpation: No palpable anomalies   Assessment   Diagnosis Status  1. Bilateral primary osteoarthritis of knee   2. Lumbar facet arthropathy   3. Chronic pain syndrome   4. History of pelvic fracture   5. Encounter for long-term opiate analgesic use   6. Medication management    Controlled Controlled Controlled   Updated Problems: No problems updated.  Plan of Care  Problem-specific:  Assessment and Plan    Chronic pain syndrome: Chronic pain in ankle, back, ribs, and knees, worsened by previous knee surgeries. Managed with oxycodone  and gabapentin  without side effects. - Continue oxycodone  5 -325 mg four times daily for 30 days. - Ensure gabapentin  prescription is available for refill. Patient's pain is controlled with oxycodone , will continue on current medication regimen.  Prescribing Augmentin (PDMP) reviewed, findings consistent with the use of prescribed medication and no evidence of narcotic misuse or abuse.  Urine drug screening (UDS) up to date and consistent with the use of prescribed medication.  No side effects or adverse reaction reported to medication.  Schedule follow-up in 90 days for medication management.  Lumbar spondylosis - Continue current pain management regimen.        Mr. Amish Mintzer has a current medication list which includes the following long-term medication(s): albuterol,  atorvastatin, gabapentin , sitagliptin, and budesonide-formoterol.  Pharmacotherapy (Medications Ordered): Meds ordered this encounter  Medications   oxyCODONE -acetaminophen  (PERCOCET) 5-325 MG tablet    Sig: Take 1 tablet by mouth every 6 (six) hours as needed for severe pain (pain score 7-10). Must last 30 days.    Dispense:  120 tablet    Refill:  0    Chronic Pain. (STOP Act - Not applicable). Fill one day early if closed on scheduled refill date.   oxyCODONE -acetaminophen  (PERCOCET) 5-325 MG tablet    Sig: Take 1 tablet by mouth every 6 (six) hours as  needed for severe pain (pain score 7-10). Must last 30 days.    Dispense:  120 tablet    Refill:  0    Chronic Pain. (STOP Act - Not applicable). Fill one day early if closed on scheduled refill date.   oxyCODONE -acetaminophen  (PERCOCET) 5-325 MG tablet    Sig: Take 1 tablet by mouth every 6 (six) hours as needed for severe pain (pain score 7-10). Must last 30 days.    Dispense:  120 tablet    Refill:  0    Chronic Pain. (STOP Act - Not applicable). Fill one day early if closed on scheduled refill date.   naloxone (NARCAN) nasal spray 4 mg/0.1 mL    Sig: Place 1 spray into the nose as needed for up to 365 doses (for opioid-induced respiratory depresssion). In case of emergency (overdose), spray once into each nostril. If no response within 3 minutes, repeat application and call 911.    Dispense:  1 each    Refill:  1    Instruct patient in proper use of device.   Orders:  No orders of the defined types were placed in this encounter.       Return in about 3 months (around 01/15/2025) for (F2F), (MM), Emmy Blanch NP.    Recent Visits No visits were found meeting these conditions. Showing recent visits within past 90 days and meeting all other requirements Today's Visits Date Type Provider Dept  10/15/24 Office Visit Suhaib Guzzo K, NP [facility]  Showing today's visits and meeting all other requirements Future  Appointments Date Type Provider Dept  01/13/25 Appointment Oryn Casanova K, NP [facility]  Showing future appointments within next 90 days and meeting all other requirements  I discussed the assessment and treatment plan with the patient. The patient was provided an opportunity to ask questions and all were answered. The patient agreed with the plan and demonstrated an understanding of the instructions.  Patient advised to call back or seek an in-person evaluation if the symptoms or condition worsens.  I personally spent a total of 30 minutes in the care of the patient today including preparing to see the patient, getting/reviewing separately obtained history, performing a medically appropriate exam/evaluation, counseling and educating, placing orders, referring and communicating with other health care professionals, documenting clinical information in the EHR, independently interpreting results, communicating results, and coordinating care.  Note by: Yovanni Frenette K Skylinn Vialpando, NP (TTS and AI technology used. I apologize for any typographical errors that were not detected and corrected.) Date: 10/15/2024; Time: 2:21 PM

## 2024-10-19 ENCOUNTER — Other Ambulatory Visit: Payer: Self-pay | Admitting: Nurse Practitioner

## 2024-10-24 ENCOUNTER — Other Ambulatory Visit: Payer: Self-pay | Admitting: *Deleted

## 2024-10-24 ENCOUNTER — Telehealth: Payer: Self-pay | Admitting: Student in an Organized Health Care Education/Training Program

## 2024-10-24 MED ORDER — IBUPROFEN 800 MG PO TABS: 800.0000 mg | ORAL_TABLET | Freq: Two times a day (BID) | ORAL | 5 refills | Status: AC | PRN

## 2024-10-24 NOTE — Telephone Encounter (Signed)
 Rx request sent to [facility].

## 2024-10-24 NOTE — Telephone Encounter (Signed)
 PT stated that he does not have any refills on his Ibuprofen . Pt stated that he was her a few days ago and did not get a refill.

## 2025-01-13 ENCOUNTER — Encounter: Admitting: Nurse Practitioner
